# Patient Record
Sex: Female | Born: 1965 | Race: Black or African American | Hispanic: No | Smoking: Never smoker
Health system: Southern US, Community
[De-identification: ages and names within clinical notes are randomized; demographics above are authoritative.]

## PROBLEM LIST (undated history)

## (undated) DIAGNOSIS — E119 Type 2 diabetes mellitus without complications: Secondary | ICD-10-CM

## (undated) DIAGNOSIS — I10 Essential (primary) hypertension: Secondary | ICD-10-CM

---

## 2006-09-03 ENCOUNTER — Emergency Department: Payer: Self-pay | Admitting: Emergency Medicine

## 2006-12-25 HISTORY — PX: BREAST BIOPSY: SHX20

## 2007-10-08 ENCOUNTER — Ambulatory Visit: Payer: Self-pay

## 2010-08-01 ENCOUNTER — Emergency Department: Payer: Self-pay | Admitting: Emergency Medicine

## 2011-07-25 ENCOUNTER — Emergency Department: Payer: Self-pay | Admitting: Internal Medicine

## 2012-05-10 ENCOUNTER — Emergency Department: Payer: Self-pay | Admitting: Emergency Medicine

## 2012-12-21 ENCOUNTER — Emergency Department: Payer: Self-pay | Admitting: Emergency Medicine

## 2013-07-21 ENCOUNTER — Emergency Department: Payer: Self-pay | Admitting: Emergency Medicine

## 2013-07-21 LAB — CBC
HCT: 30.4 % — ABNORMAL LOW (ref 35.0–47.0)
HGB: 10.7 g/dL — ABNORMAL LOW (ref 12.0–16.0)
MCH: 26.2 pg (ref 26.0–34.0)
MCHC: 32.8 g/dL (ref 32.0–36.0)
MCV: 80 fL (ref 80–100)
Platelet: 338 10*3/uL (ref 150–440)
Platelet: 341 10*3/uL (ref 150–440)
RBC: 4.13 10*6/uL (ref 3.80–5.20)
RDW: 14.8 % — ABNORMAL HIGH (ref 11.5–14.5)
WBC: 5.8 10*3/uL (ref 3.6–11.0)

## 2013-07-21 LAB — GC/CHLAMYDIA PROBE AMP

## 2013-07-21 LAB — WET PREP, GENITAL

## 2013-12-17 ENCOUNTER — Encounter: Payer: Self-pay | Admitting: Family Medicine

## 2014-03-13 ENCOUNTER — Emergency Department: Payer: Self-pay | Admitting: Emergency Medicine

## 2014-04-26 ENCOUNTER — Emergency Department: Payer: Self-pay | Admitting: Emergency Medicine

## 2014-04-26 LAB — BASIC METABOLIC PANEL
Anion Gap: 7 (ref 7–16)
BUN: 12 mg/dL (ref 7–18)
CALCIUM: 9.1 mg/dL (ref 8.5–10.1)
CREATININE: 0.96 mg/dL (ref 0.60–1.30)
Chloride: 106 mmol/L (ref 98–107)
Co2: 25 mmol/L (ref 21–32)
EGFR (Non-African Amer.): >60
Glucose: 90 mg/dL (ref 65–99)
OSMOLALITY: 275 (ref 275–301)
Potassium: 4.1 mmol/L (ref 3.5–5.1)
Sodium: 138 mmol/L (ref 136–145)

## 2014-04-26 LAB — CBC
HCT: 41.6 % (ref 35.0–47.0)
HGB: 13 g/dL (ref 12.0–16.0)
MCH: 26.5 pg (ref 26.0–34.0)
MCHC: 31.2 g/dL — AB (ref 32.0–36.0)
MCV: 85 fL (ref 80–100)
PLATELETS: 231 10*3/uL (ref 150–440)
RBC: 4.89 10*6/uL (ref 3.80–5.20)
RDW: 14.6 % — ABNORMAL HIGH (ref 11.5–14.5)
WBC: 2.9 10*3/uL — ABNORMAL LOW (ref 3.6–11.0)

## 2014-04-26 LAB — TROPONIN I: Troponin-I: <0.02 ng/mL

## 2015-07-08 ENCOUNTER — Other Ambulatory Visit: Payer: Self-pay | Admitting: Family Medicine

## 2015-07-08 DIAGNOSIS — Z1231 Encounter for screening mammogram for malignant neoplasm of breast: Secondary | ICD-10-CM

## 2015-07-12 ENCOUNTER — Ambulatory Visit
Admission: RE | Admit: 2015-07-12 | Discharge: 2015-07-12 | Disposition: A | Discharge status: Home / Self Care | Payer: Commercial Managed Care - PPO | Source: Ambulatory Visit | Attending: Family Medicine | Admitting: Family Medicine

## 2015-07-12 DIAGNOSIS — Z1231 Encounter for screening mammogram for malignant neoplasm of breast: Secondary | ICD-10-CM

## 2015-09-27 ENCOUNTER — Ambulatory Visit
Admission: RE | Admit: 2015-09-27 | Discharge: 2015-09-27 | Disposition: A | Discharge status: Home / Self Care | Payer: Commercial Managed Care - PPO | Source: Ambulatory Visit | Attending: Family Medicine | Admitting: Family Medicine

## 2015-09-27 ENCOUNTER — Other Ambulatory Visit: Payer: Self-pay | Admitting: Family Medicine

## 2015-09-27 DIAGNOSIS — R071 Chest pain on breathing: Secondary | ICD-10-CM

## 2016-09-20 ENCOUNTER — Other Ambulatory Visit: Payer: Self-pay | Admitting: Family Medicine

## 2016-09-20 DIAGNOSIS — Z1231 Encounter for screening mammogram for malignant neoplasm of breast: Secondary | ICD-10-CM

## 2016-10-12 ENCOUNTER — Ambulatory Visit: Payer: Commercial Managed Care - PPO | Attending: Family Medicine

## 2017-05-07 ENCOUNTER — Ambulatory Visit
Admission: RE | Admit: 2017-05-07 | Discharge: 2017-05-07 | Disposition: A | Discharge status: Home / Self Care | Payer: 59 | Source: Ambulatory Visit | Attending: Family Medicine | Admitting: Family Medicine

## 2017-05-07 ENCOUNTER — Other Ambulatory Visit: Payer: Self-pay | Admitting: Family Medicine

## 2017-05-07 DIAGNOSIS — M25512 Pain in left shoulder: Secondary | ICD-10-CM | POA: Insufficient documentation

## 2017-05-07 DIAGNOSIS — M542 Cervicalgia: Secondary | ICD-10-CM

## 2017-11-20 ENCOUNTER — Other Ambulatory Visit: Payer: Self-pay | Admitting: Family Medicine

## 2017-11-20 DIAGNOSIS — Z1231 Encounter for screening mammogram for malignant neoplasm of breast: Secondary | ICD-10-CM

## 2018-02-07 ENCOUNTER — Encounter (HOSPITAL_COMMUNITY): Payer: Self-pay

## 2018-02-07 ENCOUNTER — Ambulatory Visit
Admission: RE | Admit: 2018-02-07 | Discharge: 2018-02-07 | Disposition: A | Discharge status: Home / Self Care | Payer: Commercial Managed Care - PPO | Source: Ambulatory Visit | Attending: Family Medicine | Admitting: Family Medicine

## 2018-02-07 DIAGNOSIS — Z1231 Encounter for screening mammogram for malignant neoplasm of breast: Secondary | ICD-10-CM | POA: Insufficient documentation

## 2019-01-22 ENCOUNTER — Other Ambulatory Visit: Payer: Self-pay | Admitting: Family Medicine

## 2019-01-22 DIAGNOSIS — Z1231 Encounter for screening mammogram for malignant neoplasm of breast: Secondary | ICD-10-CM

## 2019-05-22 ENCOUNTER — Inpatient Hospital Stay: Admission: RE | Admit: 2019-05-22 | Payer: Commercial Managed Care - PPO | Source: Ambulatory Visit

## 2019-06-21 ENCOUNTER — Other Ambulatory Visit: Payer: Self-pay | Admitting: *Deleted

## 2019-06-21 DIAGNOSIS — Z20822 Contact with and (suspected) exposure to covid-19: Secondary | ICD-10-CM

## 2019-06-27 LAB — NOVEL CORONAVIRUS, NAA: SARS-CoV-2, NAA: NOT DETECTED

## 2019-06-30 ENCOUNTER — Telehealth: Payer: Self-pay | Admitting: Family Medicine

## 2019-06-30 NOTE — Telephone Encounter (Signed)
Patient advised of negative COVID result. Patient expressed understanding.

## 2019-07-17 ENCOUNTER — Ambulatory Visit
Admission: RE | Admit: 2019-07-17 | Discharge: 2019-07-17 | Disposition: A | Discharge status: Home / Self Care | Payer: Commercial Managed Care - PPO | Source: Ambulatory Visit | Attending: Family Medicine | Admitting: Family Medicine

## 2019-07-17 DIAGNOSIS — Z1231 Encounter for screening mammogram for malignant neoplasm of breast: Secondary | ICD-10-CM | POA: Diagnosis present

## 2020-05-10 ENCOUNTER — Ambulatory Visit: Payer: Commercial Managed Care - PPO | Attending: Internal Medicine

## 2020-05-10 DIAGNOSIS — Z23 Encounter for immunization: Secondary | ICD-10-CM

## 2020-05-10 NOTE — Progress Notes (Signed)
   Covid-19 Vaccination Clinic  Name:  Holly Herrera    MRN: [id number] DOB: April 03, 1966  05/10/2020  Holly Herrera was observed post Covid-19 immunization for 15 minutes without incident. She was provided with Vaccine Information Sheet and instruction to access the V-Safe system.   Holly Herrera was instructed to call 911 with any severe reactions post vaccine: Marland Kitchen Difficulty breathing  . Swelling of face and throat  . A fast heartbeat  . A bad rash all over body  . Dizziness and weakness   Immunizations Administered    Name Date Dose VIS Date Route   Pfizer COVID-19 Vaccine 05/10/2020 11:31 AM 0.3 mL 02/18/2019 Intramuscular   Manufacturer: [employer], [employer]   Lot: [id number]   NDC: [address]-4

## 2020-09-06 ENCOUNTER — Other Ambulatory Visit: Payer: Self-pay

## 2020-09-06 DIAGNOSIS — Z20822 Contact with and (suspected) exposure to covid-19: Secondary | ICD-10-CM

## 2020-09-07 LAB — SARS-COV-2, NAA 2 DAY TAT

## 2020-09-07 LAB — NOVEL CORONAVIRUS, NAA: SARS-CoV-2, NAA: NOT DETECTED

## 2020-11-01 ENCOUNTER — Other Ambulatory Visit: Payer: Self-pay | Admitting: Family Medicine

## 2020-11-01 DIAGNOSIS — Z1231 Encounter for screening mammogram for malignant neoplasm of breast: Secondary | ICD-10-CM

## 2020-12-09 ENCOUNTER — Ambulatory Visit
Admission: RE | Admit: 2020-12-09 | Discharge: 2020-12-09 | Disposition: A | Discharge status: Home / Self Care | Payer: BC Managed Care – PPO | Source: Ambulatory Visit | Attending: Family Medicine | Admitting: Family Medicine

## 2020-12-09 ENCOUNTER — Other Ambulatory Visit: Payer: Self-pay

## 2020-12-09 DIAGNOSIS — Z1231 Encounter for screening mammogram for malignant neoplasm of breast: Secondary | ICD-10-CM

## 2021-01-29 ENCOUNTER — Other Ambulatory Visit: Payer: Self-pay

## 2021-01-29 ENCOUNTER — Emergency Department
Admission: EM | Admit: 2021-01-29 | Discharge: 2021-01-29 | Disposition: A | Discharge status: Home / Self Care | Payer: BC Managed Care – PPO | Attending: Emergency Medicine | Admitting: Emergency Medicine

## 2021-01-29 ENCOUNTER — Emergency Department: Payer: BC Managed Care – PPO

## 2021-01-29 DIAGNOSIS — E119 Type 2 diabetes mellitus without complications: Secondary | ICD-10-CM | POA: Diagnosis not present

## 2021-01-29 DIAGNOSIS — S161XXA Strain of muscle, fascia and tendon at neck level, initial encounter: Secondary | ICD-10-CM | POA: Diagnosis not present

## 2021-01-29 DIAGNOSIS — M79602 Pain in left arm: Secondary | ICD-10-CM

## 2021-01-29 DIAGNOSIS — R079 Chest pain, unspecified: Secondary | ICD-10-CM | POA: Diagnosis not present

## 2021-01-29 DIAGNOSIS — X58XXXA Exposure to other specified factors, initial encounter: Secondary | ICD-10-CM | POA: Diagnosis not present

## 2021-01-29 DIAGNOSIS — I10 Essential (primary) hypertension: Secondary | ICD-10-CM | POA: Diagnosis not present

## 2021-01-29 DIAGNOSIS — M25512 Pain in left shoulder: Secondary | ICD-10-CM | POA: Diagnosis not present

## 2021-01-29 DIAGNOSIS — M79622 Pain in left upper arm: Secondary | ICD-10-CM | POA: Diagnosis not present

## 2021-01-29 DIAGNOSIS — S199XXA Unspecified injury of neck, initial encounter: Secondary | ICD-10-CM | POA: Diagnosis present

## 2021-01-29 HISTORY — DX: Essential (primary) hypertension: I10

## 2021-01-29 HISTORY — DX: Type 2 diabetes mellitus without complications: E11.9

## 2021-01-29 LAB — COMPREHENSIVE METABOLIC PANEL
ALT: 33 U/L (ref 0–44)
AST: 32 U/L (ref 15–41)
Albumin: 4 g/dL (ref 3.5–5.0)
Alkaline Phosphatase: 71 U/L (ref 38–126)
Anion gap: 10 (ref 5–15)
BUN: 13 mg/dL (ref 6–20)
CO2: 25 mmol/L (ref 22–32)
Calcium: 9.5 mg/dL (ref 8.9–10.3)
Chloride: 104 mmol/L (ref 98–111)
Creatinine, Ser: 0.93 mg/dL (ref 0.44–1.00)
GFR, Estimated: >60 mL/min (ref >60)
Glucose, Bld: 129 mg/dL — ABNORMAL HIGH (ref 70–99)
Potassium: 3.7 mmol/L (ref 3.5–5.1)
Sodium: 139 mmol/L (ref 135–145)
Total Bilirubin: 0.5 mg/dL (ref 0.3–1.2)
Total Protein: 7.4 g/dL (ref 6.5–8.1)

## 2021-01-29 LAB — TROPONIN I (HIGH SENSITIVITY): Troponin I (High Sensitivity): 2 ng/L (ref <18)

## 2021-01-29 LAB — BRAIN NATRIURETIC PEPTIDE: B Natriuretic Peptide: 29.9 pg/mL (ref 0.0–100.0)

## 2021-01-29 MED ORDER — CYCLOBENZAPRINE HCL 10 MG PO TABS: 10.0000 mg | ORAL_TABLET | Freq: Three times a day (TID) | ORAL | 0 refills | Status: AC | PRN

## 2021-01-29 NOTE — ED Triage Notes (Addendum)
Pt arrives via EMS for left sided neck pain that radiates to left shoulder, left arm, and left chest- pt states it woke her up at 3 this morning and she could not get the pain to go away- pt has a history of heart valve problems- per EMS 12 lead and vitals stable

## 2021-01-29 NOTE — ED Notes (Signed)
X-ray at bedside

## 2021-01-29 NOTE — ED Notes (Signed)
Dr. Bradler at bedside 

## 2021-01-29 NOTE — ED Provider Notes (Signed)
[facility] Emergency Department Provider Note   ____________________________________________   Event Date/Time   First MD Initiated Contact with Patient 01/29/21 [phone]     (approximate)  I have reviewed the triage vital signs and the nursing notes.   HISTORY  Chief Complaint Neck Pain    HPI Holly Herrera is a 55 y.o. female with a stated past medical history of type 2 diabetes, hypertension, and unknown valvular abnormality presents via EMS for left-sided neck pain that radiates to the left shoulder and left chest that began approximately 4 hours prior to arrival and has been stable since onset.  Patient describes a 5/10 aching left neck/arm/upper chest pain that does not radiate and has no exertional component.  Patient denies any change in pain with palpation.  Patient denies any similar pains in the past.  Patient currently denies any vision changes, tinnitus, difficulty speaking, facial droop, sore throat, shortness of breath, abdominal pain, nausea/vomiting/diarrhea, dysuria, or weakness/numbness/paresthesias in any extremity         Past Medical History:  Diagnosis Date  . Diabetes mellitus without complication (HCC)   . Hypertension     There are no problems to display for this patient.   Past Surgical History:  Procedure Laterality Date  . BREAST BIOPSY Left 2008   neg, done at surgeon's office- neg    Prior to Admission medications   Medication Sig Start Date End Date Taking? Authorizing Provider  cyclobenzaprine (FLEXERIL) 10 MG tablet Take 1 tablet (10 mg total) by mouth 3 (three) times daily as needed for up to 4 days for muscle spasms (left chest pain). 01/29/21 02/02/21 Yes Merwyn Katos, MD    Allergies Sulfa antibiotics  Family History  Problem Relation Age of Onset  . Breast cancer Neg Hx     Social History Social History   Tobacco Use  . Smoking status: Never Smoker  . Smokeless tobacco: Never Used    Review of  Systems Constitutional: No fever/chills Eyes: No visual changes. ENT: No sore throat.   Cardiovascular: Denies chest pain. Respiratory: Denies shortness of breath. Gastrointestinal: No abdominal pain.  No nausea, no vomiting.  No diarrhea. Genitourinary: Negative for dysuria. Musculoskeletal: Positive for acute left neck/arm/upper chest wall pain Skin: Negative for rash. Neurological: Negative for headaches, weakness/numbness/paresthesias in any extremity Psychiatric: Negative for suicidal ideation/homicidal ideation   ____________________________________________   PHYSICAL EXAM:  VITAL SIGNS: ED Triage Vitals  Enc Vitals Group     BP 01/29/21 0736 131/84     Pulse Rate 01/29/21 0736 64     Resp 01/29/21 0736 18     Temp 01/29/21 0736 98.2 F (36.8 C)     Temp Source 01/29/21 0736 Oral     SpO2 01/29/21 0736 100 %     Weight 01/29/21 0738 187 lb (84.8 kg)     Height 01/29/21 0738 5\' 5"  (1.651 m)     Head Circumference --      Peak Flow --      Pain Score 01/29/21 0737 5     Pain Loc --      Pain Edu? --      Excl. in GC? --    Constitutional: Alert and oriented. Well appearing and in no acute distress. Eyes: Conjunctivae are normal. PERRL. Head: Atraumatic. Nose: No congestion/rhinnorhea. Mouth/Throat: Mucous membranes are moist. Neck: No stridor, mild tenderness to palpation in left paraspinal musculature Cardiovascular: Grossly normal heart sounds.  Good peripheral circulation. Respiratory: Normal respiratory effort.  No retractions. Gastrointestinal: Soft and nontender. No distention. Musculoskeletal: No obvious deformities Neurologic:  Normal speech and language. No gross focal neurologic deficits are appreciated. Skin:  Skin is warm and dry. No rash noted. Psychiatric: Mood and affect are normal. Speech and behavior are normal.  ____________________________________________   LABS (all labs ordered are listed, but only abnormal results are displayed)  Labs  Reviewed  COMPREHENSIVE METABOLIC PANEL - Abnormal; Notable for the following components:      Result Value   Glucose, Bld 129 (*)    All other components within normal limits  BRAIN NATRIURETIC PEPTIDE  TROPONIN I (HIGH SENSITIVITY)  TROPONIN I (HIGH SENSITIVITY)   ____________________________________________  EKG  ED ECG REPORT I, Merwyn Katos, the attending physician, personally viewed and interpreted this ECG.  Date: 01/29/2021 EKG Time: 0742 Rate: 61 Rhythm: normal sinus rhythm QRS Axis: normal Intervals: normal ST/T Wave abnormalities: normal Narrative Interpretation: no evidence of acute ischemia  ____________________________________________  RADIOLOGY  ED MD interpretation: One-view portable chest x-ray shows no evidence of acute abnormalities including no pneumonia, pneumothorax, or widened mediastinum  Official radiology report(s): DG Chest Port 1 View  Result Date: 01/29/2021 CLINICAL DATA:  Chest pain EXAM: PORTABLE CHEST 1 VIEW COMPARISON:  09/27/2015 FINDINGS: Normal heart size and mediastinal contours. No acute infiltrate or edema. No effusion or pneumothorax. No acute osseous findings. IMPRESSION: Negative chest. Electronically Signed   By: Marnee Spring M.D.   On: 01/29/2021 08:02    ____________________________________________   PROCEDURES  Procedure(s) performed (including Critical Care):  .1-3 Lead EKG Interpretation Performed by: Merwyn Katos, MD Authorized by: Merwyn Katos, MD     Interpretation: normal     ECG rate:  64   ECG rate assessment: normal     Rhythm: sinus rhythm     Ectopy: none     Conduction: normal       ____________________________________________   INITIAL IMPRESSION / ASSESSMENT AND PLAN / ED COURSE  As part of my medical decision making, I reviewed the following data within the electronic MEDICAL RECORD NUMBER Nursing notes reviewed and incorporated, Labs reviewed, EKG interpreted, Old chart reviewed,  Radiograph reviewed and Notes from prior ED visits reviewed and incorporated        + neck pain + Left arm pain  ED Workup: Defer C-Spine imaging given negative by NEXUS criteria  Given History, Exam the patient appears to have a cervical radiculopathy.   Patient appears to be low risk for complications or other emergent conditions such as  anginal equivalent, frank cervical instability, arterial dissection, osteomyelitis, epidural abscess, central cord syndrome, c-spine fracture, other spinal emergencies  Rx: NSAIDs, outpatient physical therapy evaluation and recommendation for home exercises in the interim Disposition: Discharge. The patient has been given strict return precautions and understands the need to follow up within 48 hours with their primary care provider      ____________________________________________   FINAL CLINICAL IMPRESSION(S) / ED DIAGNOSES  Final diagnoses:  Acute strain of neck muscle, initial encounter  Musculoskeletal pain of left upper extremity     ED Discharge Orders         Ordered    cyclobenzaprine (FLEXERIL) 10 MG tablet  3 times daily PRN        01/29/21 0859           Note:  This document was prepared using Dragon voice recognition software and may include unintentional dictation errors.   Merwyn Katos, MD 01/29/21 [phone]

## 2021-06-28 ENCOUNTER — Emergency Department: Payer: Self-pay

## 2021-06-28 ENCOUNTER — Other Ambulatory Visit: Payer: Self-pay

## 2021-06-28 ENCOUNTER — Emergency Department
Admission: EM | Admit: 2021-06-28 | Discharge: 2021-06-28 | Disposition: A | Discharge status: Home / Self Care | Payer: Self-pay | Attending: Emergency Medicine | Admitting: Emergency Medicine

## 2021-06-28 DIAGNOSIS — Z79899 Other long term (current) drug therapy: Secondary | ICD-10-CM | POA: Insufficient documentation

## 2021-06-28 DIAGNOSIS — E119 Type 2 diabetes mellitus without complications: Secondary | ICD-10-CM | POA: Insufficient documentation

## 2021-06-28 DIAGNOSIS — G4762 Sleep related leg cramps: Secondary | ICD-10-CM | POA: Insufficient documentation

## 2021-06-28 DIAGNOSIS — R42 Dizziness and giddiness: Secondary | ICD-10-CM | POA: Insufficient documentation

## 2021-06-28 DIAGNOSIS — R002 Palpitations: Secondary | ICD-10-CM | POA: Insufficient documentation

## 2021-06-28 DIAGNOSIS — I10 Essential (primary) hypertension: Secondary | ICD-10-CM | POA: Insufficient documentation

## 2021-06-28 LAB — CBC
HCT: 45.3 % (ref 36.0–46.0)
Hemoglobin: 14.7 g/dL (ref 12.0–15.0)
MCH: 27.6 pg (ref 26.0–34.0)
MCHC: 32.5 g/dL (ref 30.0–36.0)
MCV: 85.2 fL (ref 80.0–100.0)
Platelets: 316 10*3/uL (ref 150–400)
RBC: 5.32 MIL/uL — ABNORMAL HIGH (ref 3.87–5.11)
RDW: 13.5 % (ref 11.5–15.5)
WBC: 8.5 10*3/uL (ref 4.0–10.5)
nRBC: 0 % (ref 0.0–0.2)

## 2021-06-28 LAB — BASIC METABOLIC PANEL
Anion gap: 11 (ref 5–15)
BUN: 14 mg/dL (ref 6–20)
CO2: 26 mmol/L (ref 22–32)
Calcium: 9.9 mg/dL (ref 8.9–10.3)
Chloride: 99 mmol/L (ref 98–111)
Creatinine, Ser: 1.02 mg/dL — ABNORMAL HIGH (ref 0.44–1.00)
GFR, Estimated: >60 mL/min (ref >60)
Glucose, Bld: 180 mg/dL — ABNORMAL HIGH (ref 70–99)
Potassium: 3.4 mmol/L — ABNORMAL LOW (ref 3.5–5.1)
Sodium: 136 mmol/L (ref 135–145)

## 2021-06-28 LAB — TROPONIN I (HIGH SENSITIVITY): Troponin I (High Sensitivity): 3 ng/L (ref <18)

## 2021-06-28 NOTE — ED Notes (Signed)
Patient transported to X-ray 

## 2021-06-28 NOTE — ED Provider Notes (Signed)
[facility] Emergency Department Provider Note  Time seen: 9:38 AM  I have reviewed the triage vital signs and the nursing notes.   HISTORY  Chief Complaint Palpitations   HPI Holly Herrera is a 55 y.o. female with a past medical history of diabetes, hypertension, presents to the emergency department for palpitations and not feeling well.  According to the patient she was feeling somewhat dizzy yesterday thought her blood pressure to be elevated states she recently ran out of her metoprolol so she took one of her husbands blood pressure pills quinapril.  Patient states she was up all night urinating feeling like her legs are cramping and having palpitations.  Patient was concerned so she came to the emergency department this morning for evaluation.  Patient states she is feeling better.  Vital signs are reassuring, pulse rate is slightly elevated at 98 blood pressure is reassuring.  Patient denies any chest pain nausea or shortness of breath.  No abdominal pain.  Largely negative review of systems.  Past Medical History:  Diagnosis Date   Diabetes mellitus without complication (HCC)    Hypertension     There are no problems to display for this patient.   Past Surgical History:  Procedure Laterality Date   BREAST BIOPSY Left 2008   neg, done at surgeon's office- neg    Prior to Admission medications   Not on File    Allergies  Allergen Reactions   Sulfa Antibiotics     Family History  Problem Relation Age of Onset   Breast cancer Neg Hx     Social History Social History   Tobacco Use   Smoking status: Never   Smokeless tobacco: Never  Substance Use Topics   Alcohol use: Yes   Drug use: Not Currently    Review of Systems Constitutional: Negative for fever. Cardiovascular: Negative for chest pain.  Positive for palpitations. Respiratory: Negative for shortness of breath. Gastrointestinal: Negative for abdominal pain Genitourinary:  Negative for urinary compaints Musculoskeletal: Negative for musculoskeletal complaints Neurological: Negative for headache All other ROS negative  ____________________________________________   PHYSICAL EXAM:  VITAL SIGNS: ED Triage Vitals  Enc Vitals Group     BP 06/28/21 0915 123/79     Pulse Rate 06/28/21 0915 98     Resp 06/28/21 0915 17     Temp 06/28/21 0915 98 F (36.7 C)     Temp Source 06/28/21 0915 Oral     SpO2 06/28/21 0915 98 %     Weight 06/28/21 0913 181 lb (82.1 kg)     Height 06/28/21 0913 5\' 5"  (1.651 m)     Head Circumference --      Peak Flow --      Pain Score 06/28/21 0912 7     Pain Loc --      Pain Edu? --      Excl. in GC? --    Constitutional: Alert and oriented. Well appearing and in no distress. Eyes: Normal exam ENT      Head: Normocephalic and atraumatic.      Mouth/Throat: Mucous membranes are moist. Cardiovascular: Normal rate, regular rhythm.  Respiratory: Normal respiratory effort without tachypnea nor retractions. Breath sounds are clear  Gastrointestinal: Soft and nontender. No distention. Musculoskeletal: Nontender with normal range of motion in all extremities.  Neurologic:  Normal speech and language. No gross focal neurologic deficits  Skin:  Skin is warm, dry and intact.  Psychiatric: Mood and affect are normal.   ____________________________________________  EKG  EKG viewed and interpreted by myself shows a normal sinus rhythm at 97 bpm with a narrow QRS, normal axis, normal intervals, no concerning ST changes.   INITIAL IMPRESSION / ASSESSMENT AND PLAN / ED COURSE  Pertinent labs & imaging results that were available during my care of the patient were reviewed by me and considered in my medical decision making (see chart for details).   Patient presents emergency department for palpitations dizziness after taking one of her husbands blood pressure pills.  Patient took the pill around 7:30 PM last night states she was  up all night urinating with leg cramps and palpitations.  Patient admits that she was very nervous a whole night states she was up "praying all night."  We will check basic labs, EKG, troponin and continue to closely monitor.  Overall the patient appears well with reassuring vitals and reassuring physical exam  Holly Herrera was evaluated in Emergency Department on 06/28/2021 for the symptoms described in the history of present illness. She was evaluated in the context of the global COVID-19 pandemic, which necessitated consideration that the patient might be at risk for infection with the SARS-CoV-2 virus that causes COVID-19. Institutional protocols and algorithms that pertain to the evaluation of patients at risk for COVID-19 are in a state of rapid change based on information released by regulatory bodies including the CDC and federal and state organizations. These policies and algorithms were followed during the patient's care in the ED.  ____________________________________________   FINAL CLINICAL IMPRESSION(S) / ED DIAGNOSES  Palpitations   Minna Antis, MD 06/28/21 [phone]

## 2021-06-28 NOTE — ED Triage Notes (Signed)
Pt states she ran out of her normal metoprolol and took one of her husbands quinapril, states she was up all night feeling like her heart was racing and having leg cramps.

## 2021-07-04 ENCOUNTER — Other Ambulatory Visit: Payer: Self-pay

## 2021-07-04 ENCOUNTER — Ambulatory Visit: Payer: Self-pay | Admitting: Physician Assistant

## 2021-07-04 DIAGNOSIS — Z113 Encounter for screening for infections with a predominantly sexual mode of transmission: Secondary | ICD-10-CM

## 2021-07-04 LAB — WET PREP FOR TRICH, YEAST, CLUE
Trichomonas Exam: NEGATIVE
Yeast Exam: NEGATIVE

## 2021-07-04 NOTE — Progress Notes (Signed)
Pt here for STD screening.  She has a UTI and is being treated with Macrodantin 100 mg QID.  Pt given condoms. Berdie Ogren, RN

## 2021-07-05 ENCOUNTER — Encounter: Payer: Self-pay | Admitting: Physician Assistant

## 2021-07-05 NOTE — Progress Notes (Signed)
[facility] Department STI clinic/screening visit  Subjective:  Holly Herrera is a 55 y.o. female being seen today for an STI screening visit. The patient reports they do not have symptoms.  Patient reports that they do not desire a pregnancy in the next year.   They reported they are not interested in discussing contraception today.  Patient's last menstrual period was 06/12/2015.   Patient has the following medical conditions:  There are no problems to display for this patient.   Chief Complaint  Patient presents with   SEXUALLY TRANSMITTED DISEASE    screening    HPI  Patient reports that she is not having any symptoms but would like a screening.  States that she has had an UTI and feels that every time she and her husband have sex, she gets an UTI and wants to make sure nothing else is going on.  Reports that she takes medicines as directed for HTN and DM and sees her PCP regularly.  Reports that she is postmenopausal and has not had a period in years, last HIV test and last pap was in 2020.   See flowsheet for further details and programmatic requirements.    The following portions of the patient's history were reviewed and updated as appropriate: allergies, current medications, past medical history, past social history, past surgical history and problem list.  Objective:  There were no vitals filed for this visit.  Physical Exam Constitutional:      General: She is not in acute distress.    Appearance: Normal appearance.  HENT:     Head: Normocephalic and atraumatic.     Comments: No nits,lice, or hair loss. No cervical, supraclavicular or axillary adenopathy.     Mouth/Throat:     Mouth: Mucous membranes are moist.     Pharynx: Oropharynx is clear. No oropharyngeal exudate or posterior oropharyngeal erythema.  Eyes:     Conjunctiva/sclera: Conjunctivae normal.  Pulmonary:     Effort: Pulmonary effort is normal.  Abdominal:     Palpations: Abdomen is  soft. There is no mass.     Tenderness: There is no abdominal tenderness. There is no guarding or rebound.  Genitourinary:    General: Normal vulva.     Rectum: Normal.     Comments: External genitalia/pubic area without nits, lice, edema, erythema, lesions and inguinal adenopathy. Vagina with normal mucosa and discharge. Cervix without visible lesions. Uterus firm, mobile, nt, no masses, no CMT, no adnexal tenderness or fullness.  Musculoskeletal:     Cervical back: Neck supple. No tenderness.  Skin:    General: Skin is warm and dry.     Findings: No bruising, erythema, lesion or rash.  Neurological:     Mental Status: She is alert and oriented to person, place, and time.  Psychiatric:        Mood and Affect: Mood normal.        Behavior: Behavior normal.        Thought Content: Thought content normal.        Judgment: Judgment normal.     Assessment and Plan:  Holly Herrera is a 55 y.o. female presenting to the [facility] Department for STI screening  1. Screening for STD (sexually transmitted disease) Patient into clinic without symptoms. Reviewed wet mount results and no treatment is indicated today. Rec condoms with all sex. Await test results.  Counseled that RN will call if needs to RTC for treatment once results are back.  -  WET PREP FOR TRICH, YEAST, CLUE - Chlamydia/Gonorrhea [place] [facility] - HIV [place] [facility] - Syphilis Serology,  Lab     No follow-ups on file.  No future appointments.  Matt Holmes, PA

## 2021-07-07 LAB — HM HIV SCREENING LAB: HM HIV Screening: NEGATIVE

## 2021-07-14 ENCOUNTER — Telehealth: Payer: Self-pay | Admitting: Family Medicine

## 2021-07-14 NOTE — Telephone Encounter (Signed)
Patient called to see if her results were in yet.

## 2022-01-31 ENCOUNTER — Other Ambulatory Visit: Payer: Self-pay | Admitting: Family Medicine

## 2022-01-31 DIAGNOSIS — Z1231 Encounter for screening mammogram for malignant neoplasm of breast: Secondary | ICD-10-CM

## 2022-02-07 DIAGNOSIS — K2270 Barrett's esophagus without dysplasia: Secondary | ICD-10-CM

## 2022-02-13 ENCOUNTER — Other Ambulatory Visit: Payer: Self-pay

## 2022-02-13 ENCOUNTER — Ambulatory Visit
Admission: RE | Admit: 2022-02-13 | Discharge: 2022-02-13 | Disposition: A | Discharge status: Home / Self Care | Payer: Self-pay | Source: Ambulatory Visit | Attending: Family Medicine | Admitting: Family Medicine

## 2022-02-13 DIAGNOSIS — Z1231 Encounter for screening mammogram for malignant neoplasm of breast: Secondary | ICD-10-CM | POA: Insufficient documentation

## 2022-03-13 ENCOUNTER — Ambulatory Visit: Payer: Self-pay

## 2022-03-15 ENCOUNTER — Emergency Department
Admission: EM | Admit: 2022-03-15 | Discharge: 2022-03-15 | Disposition: A | Discharge status: Home / Self Care | Payer: 59 | Attending: Emergency Medicine | Admitting: Emergency Medicine

## 2022-03-15 ENCOUNTER — Other Ambulatory Visit: Payer: Self-pay

## 2022-03-15 DIAGNOSIS — R519 Headache, unspecified: Secondary | ICD-10-CM | POA: Diagnosis present

## 2022-03-15 DIAGNOSIS — E1165 Type 2 diabetes mellitus with hyperglycemia: Secondary | ICD-10-CM | POA: Diagnosis not present

## 2022-03-15 DIAGNOSIS — I10 Essential (primary) hypertension: Secondary | ICD-10-CM | POA: Insufficient documentation

## 2022-03-15 DIAGNOSIS — Z7984 Long term (current) use of oral hypoglycemic drugs: Secondary | ICD-10-CM | POA: Insufficient documentation

## 2022-03-15 LAB — BASIC METABOLIC PANEL
Anion gap: 9 (ref 5–15)
BUN: 10 mg/dL (ref 6–20)
CO2: 26 mmol/L (ref 22–32)
Calcium: 9.2 mg/dL (ref 8.9–10.3)
Chloride: 99 mmol/L (ref 98–111)
Creatinine, Ser: 0.78 mg/dL (ref 0.44–1.00)
GFR, Estimated: >60 mL/min (ref >60)
Glucose, Bld: 270 mg/dL — ABNORMAL HIGH (ref 70–99)
Potassium: 3.5 mmol/L (ref 3.5–5.1)
Sodium: 134 mmol/L — ABNORMAL LOW (ref 135–145)

## 2022-03-15 LAB — URINALYSIS, ROUTINE W REFLEX MICROSCOPIC
Bacteria, UA: NONE SEEN
Bilirubin Urine: NEGATIVE
Glucose, UA: >=500 mg/dL — AB
Ketones, ur: 5 mg/dL — AB
Leukocytes,Ua: NEGATIVE
Nitrite: NEGATIVE
Protein, ur: NEGATIVE mg/dL
Specific Gravity, Urine: 1.027 (ref 1.005–1.030)
pH: 5 (ref 5.0–8.0)

## 2022-03-15 LAB — CBC
HCT: 41.1 % (ref 36.0–46.0)
Hemoglobin: 13.2 g/dL (ref 12.0–15.0)
MCH: 27.4 pg (ref 26.0–34.0)
MCHC: 32.1 g/dL (ref 30.0–36.0)
MCV: 85.3 fL (ref 80.0–100.0)
Platelets: 269 10*3/uL (ref 150–400)
RBC: 4.82 MIL/uL (ref 3.87–5.11)
RDW: 13.4 % (ref 11.5–15.5)
WBC: 6 10*3/uL (ref 4.0–10.5)
nRBC: 0 % (ref 0.0–0.2)

## 2022-03-15 LAB — CBG MONITORING, ED: Glucose-Capillary: 231 mg/dL — ABNORMAL HIGH (ref 70–99)

## 2022-03-15 NOTE — ED Provider Notes (Signed)
? ?[facility] ?Provider Note ? ? ? Event Date/Time  ? First MD Initiated Contact with Patient 03/15/22 2308   ?  (approximate) ? ? ?History  ? ?Hyperglycemia ? ? ?HPI ? ?Holly Herrera is a 56 y.o. female who recently lost her husband.  She reports he had a 3 stents placed and was supposed to stay in the hospital but would not and she came home after a week and found him dead.  Today she comes in complaining of waking up with a headache that gradually got worse over the course of the day it is pounding on the right side.  She has not had any headaches like this in the past.  Her blood sugar was also high and her blood pressure was high at work.  She took 2 metformin's on her other medicines.  Headache has improved since she has been here in the emergency room.  When I finished examining her the headache was gone.  She feels better and wants to go home.  She does not want a CAT scan.  With the headache being gone now and having a nonfocal neurological exam is probably not necessary to do a CT. ? ?  ? ? ?Physical Exam  ? ?Triage Vital Signs: ?ED Triage Vitals  ?Enc Vitals Group  ?   BP 03/15/22 2138 (!) 157/100  ?   Pulse Rate 03/15/22 2138 81  ?   Resp 03/15/22 2138 19  ?   Temp 03/15/22 2138 98.3 ?F (36.8 ?C)  ?   Temp src --   ?   SpO2 03/15/22 2138 99 %  ?   Weight 03/15/22 2141 175 lb (79.4 kg)  ?   Height 03/15/22 2141 5\' 6"  (1.676 m)  ?   Head Circumference --   ?   Peak Flow --   ?   Pain Score 03/15/22 2141 9  ?   Pain Loc --   ?   Pain Edu? --   ?   Excl. in GC? --   ? ? ?Most recent vital signs: ?Vitals:  ? 03/15/22 2237 03/15/22 2239  ?BP: 133/83 129/79  ?Pulse: 83 85  ?Resp:  16  ?Temp:    ?SpO2: 100% 100%  ? ? ? ?General: Awake, no distress.  ?Head normocephalic atraumatic ?Eyes pupils equal round reactive extraocular movements intact fundi look normal ?Mouth no erythema or exudate normal ?CV:  Good peripheral perfusion.  Heart regular rate and rhythm no audible  murmur ?Resp:  Normal effort.  Lungs are clear ?Abd:  No distention.  Soft bowel sounds positive no organomegaly ?Neuro exam cranial nerves II through XII are intact although visual fields were not checked cerebellar finger-nose is normal motor strength is 5/5 throughout patient does not report any numbness. ? ? ?ED Results / Procedures / Treatments  ? ?Labs ?(all labs ordered are listed, but only abnormal results are displayed) ?Labs Reviewed  ?BASIC METABOLIC PANEL - Abnormal; Notable for the following components:  ?    Result Value  ? Sodium 134 (*)   ? Glucose, Bld 270 (*)   ? All other components within normal limits  ?URINALYSIS, ROUTINE W REFLEX MICROSCOPIC - Abnormal; Notable for the following components:  ? Color, Urine YELLOW (*)   ? APPearance HAZY (*)   ? Glucose, UA >=500 (*)   ? Hgb urine dipstick SMALL (*)   ? Ketones, ur 5 (*)   ? All other components within normal limits  ?CBG  MONITORING, ED - Abnormal; Notable for the following components:  ? Glucose-Capillary 231 (*)   ? All other components within normal limits  ?CBC  ?CBG MONITORING, ED  ?CBG MONITORING, ED  ? ? ? ?EKG ? ?Cardiac monitor shows normal sinus rhythm at 80 ? ? ?RADIOLOGY ? ? ? ?PROCEDURES: ? ?Critical Care performe ? ?Procedures ? ? ?MEDICATIONS ORDERED IN ED: ?Medications - No data to display ? ? ?IMPRESSION / MDM / ASSESSMENT AND PLAN / ED COURSE  ?I reviewed the triage vital signs and the nursing notes. ?Patient had a right-sided throbbing headache which she has not had before.  She does not have any temporal tenderness.  Headache resolved on its own.  It may have been related to her elevated blood pressure is difficult to say.  May also been due to stress with the recent death of her husband. ? ?With a nonfocal neurological exam there is resolution of the headache normal funduscopic normal.ed10 ?Vital signs now.  I believe this is a nonspecific headache.  I will let her go.  I will have her follow-up with her regular doctor.  I  do not think we need to do any other further testing at this point.  I will have her return if the headache returns or her symptoms worsen again tonight. ?She does not have a sore throat or cough or fever or runny nose shortness of breath or chest tightness or pain or any other complaints.  There is no sign of infection.  Her white blood count is normal she is not anemic her platelet count is normal her electrolytes are okay sugar is slightly high now at 270 but this is really nonspecific especially with the stress. ? ?Patient does have hypertension and diabetes.  He also has high cholesterol.  She has ?The patient is on the cardiac monitor to evaluate for evidence of arrhythmia and/or significant heart rate changes.  None are seen blood pressure is 129/79.  Patient feels well I will let her go if she wishes. ? ?  ? ? ?FINAL CLINICAL IMPRESSION(S) / ED DIAGNOSES  ? ?Final diagnoses:  ?Acute nonintractable headache, unspecified headache type  ? ? ? ?Rx / DC Orders  ? ?ED Discharge Orders   ? ? None  ? ?  ? ? ? ?Note:  This document was prepared using Dragon voice recognition software and may include unintentional dictation errors. ?  ?Arnaldo Natal, MD ?03/15/22 2328 ? ?

## 2022-03-15 NOTE — Discharge Instructions (Signed)
Please return if headache gets bad again or if you have any other problems.  Otherwise please follow-up with your doctor within about a week or so.Marland Kitchen ?

## 2022-03-15 NOTE — ED Notes (Signed)
RN resized pt bp cuff. RN changed bp cuff to a bigger size due to pt circumference of her upper arm. ?

## 2022-03-15 NOTE — ED Triage Notes (Signed)
Pt presents to ER c/o hyperglycemia and HA that started this morning.  Pt states she checked her CBG this morning and it was 350.  Pt also c/o HA that has been constant throughout the day.  Pt also c/o nausea but denies vomiting.  Pt denies sick comtacts.  Pt is A&O x4 at this time in NAD.   ?

## 2022-07-05 ENCOUNTER — Ambulatory Visit
Admission: RE | Admit: 2022-07-05 | Discharge: 2022-07-05 | Disposition: A | Discharge status: Home / Self Care | Payer: 59 | Source: Ambulatory Visit | Attending: Nurse Practitioner | Admitting: Nurse Practitioner

## 2022-07-05 ENCOUNTER — Other Ambulatory Visit: Payer: Self-pay | Admitting: Nurse Practitioner

## 2022-07-05 DIAGNOSIS — R52 Pain, unspecified: Secondary | ICD-10-CM

## 2022-08-30 ENCOUNTER — Encounter: Payer: Self-pay | Admitting: Cardiovascular Disease

## 2022-08-30 ENCOUNTER — Ambulatory Visit: Payer: Self-pay | Attending: Cardiovascular Disease | Admitting: Cardiovascular Disease

## 2022-09-01 ENCOUNTER — Encounter: Payer: Self-pay | Admitting: Cardiovascular Disease

## 2022-09-01 ENCOUNTER — Ambulatory Visit: Payer: Self-pay | Attending: Cardiovascular Disease | Admitting: Cardiovascular Disease

## 2022-09-01 VITALS — BP 128/78 | HR 75 | Ht 65.5 in | Wt 182.2 lb

## 2022-09-01 DIAGNOSIS — I479 Paroxysmal tachycardia, unspecified: Secondary | ICD-10-CM

## 2022-09-01 DIAGNOSIS — E119 Type 2 diabetes mellitus without complications: Secondary | ICD-10-CM

## 2022-09-01 DIAGNOSIS — E782 Mixed hyperlipidemia: Secondary | ICD-10-CM

## 2022-09-01 DIAGNOSIS — I10 Essential (primary) hypertension: Secondary | ICD-10-CM

## 2022-09-01 DIAGNOSIS — R002 Palpitations: Secondary | ICD-10-CM

## 2022-09-01 NOTE — Progress Notes (Signed)
Cardiology Office Note  Date:  09/01/2022   ID:  Holly Herrera, DOB 12-Nov-1966, MRN [id number]  PCP:  Armando Gang, FNP   Chief Complaint  Patient presents with   New Patient (Initial Visit)    Ref by Dr. Thad Ranger for palpitations & heart racing with a family Hx of her father deceased 30 with MI/CHF & a brother deceased at 10 with an MI. Medications reviewed by the patient verbally.     HPI:  Holly Herrera is a 56 year old woman with past medical history of Diabetes type 2 Hypertension Hyperlipidemia Referred by Thad Ranger for consultation of her palpitations/" heart racing"  Previously seen by cardiology 2020 at [facility]  Previously reported episodes of chest pain, Myoview July 2020 with no ischemia Felt to have atypical chest pain  Echocardiogram also completed at that time NORMAL LEFT VENTRICULAR SYSTOLIC FUNCTION  NORMAL RIGHT VENTRICULAR SYSTOLIC FUNCTION  MILD VALVULAR REGURGITATION (See above)  NO VALVULAR STENOSIS  MILD MR, TR  TRIVIAL AR, PR  EF 55-60%   Seen in the emergency room June 28, 2021 for palpitations, dizziness after taking one of her husbands blood pressure pills Work-up negative  In follow-up today she reports that she is feeling relatively well Blood pressure has been well controlled, denies significant leg swelling or shortness of breath Takes chlorthalidone as needed with potassium for leg swelling Denies chest pain concerning for angina Concerned about following cardiac issues given strong family history  EKG personally reviewed by myself on todays visit NSR rate 75 bpm no significant ST or T wave changes  PMH:   has a past medical history of Diabetes mellitus without complication (HCC) and Hypertension.  PSH:    Past Surgical History:  Procedure Laterality Date   BREAST BIOPSY Left 2008   neg, done at surgeon's office- neg    Current Outpatient Medications  Medication Sig Dispense Refill   atorvastatin (LIPITOR) 20 MG  tablet SMARTSIG:1 Tablet(s) By Mouth Every Evening     chlorthalidone (HYGROTON) 25 MG tablet Take 25 mg by mouth daily as needed.     cyclobenzaprine (FLEXERIL) 10 MG tablet Take 10 mg by mouth daily.     FEROSUL 325 (65 Fe) MG tablet Take 325 mg by mouth daily.     glipiZIDE (GLUCOTROL) 5 MG tablet Take 5 mg by mouth daily.     losartan (COZAAR) 100 MG tablet Take 100 mg by mouth daily.     meloxicam (MOBIC) 15 MG tablet Take 15 mg by mouth daily as needed.     potassium chloride (KLOR-CON) 10 MEQ tablet Take by mouth.     traZODone (DESYREL) 50 MG tablet Take 50 mg by mouth at bedtime.     metFORMIN (GLUCOPHAGE) 500 MG tablet Take 500 mg by mouth 2 (two) times daily. (Patient not taking: Reported on 09/01/2022)     nitrofurantoin (MACRODANTIN) 100 MG capsule Take 100 mg by mouth 4 (four) times daily. (Patient not taking: Reported on 09/01/2022)     No current facility-administered medications for this visit.    Allergies:   Sulfa antibiotics   Social History:  The patient  reports that she has never smoked. She has never used smokeless tobacco. She reports current alcohol use. She reports that she does not currently use drugs.   Family History:   family history includes Cerebral aneurysm in her mother; Heart attack (age of onset: 68) in her father; Heart attack (age of onset: 46) in her brother; Heart failure in  her father; Hypertension in her sister.    Review of Systems: Review of Systems  Constitutional: Negative.   HENT: Negative.    Respiratory: Negative.    Cardiovascular: Negative.   Gastrointestinal: Negative.   Musculoskeletal: Negative.   Neurological: Negative.   Psychiatric/Behavioral: Negative.    All other systems reviewed and are negative.    PHYSICAL EXAM: VS:  BP 128/78 (BP Location: Right Arm, Patient Position: Sitting, Cuff Size: Normal)   Pulse 75   Ht 5' 5.5" (1.664 m)   Wt 182 lb 4 oz (82.7 kg)   LMP 06/12/2015 Comment: premenopausal  BMI 29.87 kg/m  ,  BMI Body mass index is 29.87 kg/m. GEN: Well nourished, well developed, in no acute distress HEENT: normal Neck: no JVD, carotid bruits, or masses Cardiac: RRR; no murmurs, rubs, or gallops,no edema  Respiratory:  clear to auscultation bilaterally, normal work of breathing GI: soft, nontender, nondistended, + BS MS: no deformity or atrophy Skin: warm and dry, no rash Neuro:  Strength and sensation are intact Psych: euthymic mood, full affect    Recent Labs: 03/15/2022: BUN 10; Creatinine, Ser 0.78; Hemoglobin 13.2; Platelets 269; Potassium 3.5; Sodium 134    Lipid Panel No results found for: "CHOL", "HDL", "LDLCALC", "TRIG"    Wt Readings from Last 3 Encounters:  09/01/22 182 lb 4 oz (82.7 kg)  03/15/22 175 lb (79.4 kg)  06/28/21 181 lb (82.1 kg)       ASSESSMENT AND PLAN:  Problem List Items Addressed This Visit   None Visit Diagnoses     Palpitations    -  Primary   Paroxysmal tachycardia (HCC)       Relevant Medications   losartan (COZAAR) 100 MG tablet   Diabetes mellitus type 2 in nonobese (HCC)       Relevant Medications   losartan (COZAAR) 100 MG tablet   glipiZIDE (GLUCOTROL) 5 MG tablet   Mixed hyperlipidemia       Relevant Medications   losartan (COZAAR) 100 MG tablet   Primary hypertension       Relevant Medications   losartan (COZAAR) 100 MG tablet     Essential hypertension Blood pressure is well controlled on today's visit. No changes made to the medications.  Palpitations Seems to happen more in the evening when she is laying down but typically resolved without intervention We have suggested if symptoms get worse she call us, Zio monitor could be ordered We could also restart her beta-blocker.  She was previously on metoprolol succinate 50 daily.  She does have leftover prescription at home that she can take as needed Recommend she call us if symptoms get worse  Diabetes type 2 We have encouraged continued exercise, careful diet management  in an effort to lose weight. She reports weight has been trending down used to be 220 now 182  Hyperlipidemia Minute by primary care , on Lipitor    Total encounter time more than 50 minutes  Greater than 50% was spent in counseling and coordination of care with the patient    Signed, Dossie Arbour, M.D., Ph.D. [facility] [place], [place] [phone]

## 2022-09-01 NOTE — Patient Instructions (Addendum)
Medication Instructions:  No changes  If you need a refill on your cardiac medications before your next appointment, please call your pharmacy.   Lab work: No new labs needed  Testing/Procedures: No new testing needed  Follow-Up: At [facility], you and your health needs are our priority.  As part of our continuing mission to provide you with exceptional heart care, we have created designated Provider Care Teams.  These Care Teams include your primary Cardiologist (physician) and Advanced Practice Providers (APPs -  Physician Assistants and Nurse Practitioners) who all work together to provide you with the care you need, when you need it.  You will need a follow up appointment in 12 months Your physician wants you to follow-up in: 1 year. You will receive a reminder letter in the mail two months in advance. If you don't receive a letter, please call our office to schedule the follow-up appointment.   Providers on your designated Care Team:   Nicolasa Ducking, NP Eula Listen, PA-C Cadence Fransico Michael, [place]  COVID-19 Vaccine Information can be found at: [email or web address] For questions related to vaccine distribution or appointments, please email [email or web address] or call [phone].

## 2022-10-26 LAB — EXTERNAL GENERIC LAB PROCEDURE

## 2022-11-29 LAB — EXTERNAL GENERIC LAB PROCEDURE

## 2023-01-18 ENCOUNTER — Other Ambulatory Visit: Payer: Self-pay | Admitting: Nurse Practitioner

## 2023-01-18 ENCOUNTER — Ambulatory Visit
Admission: RE | Admit: 2023-01-18 | Discharge: 2023-01-18 | Disposition: A | Discharge status: Home / Self Care | Payer: BLUE CROSS/BLUE SHIELD | Source: Ambulatory Visit | Attending: Nurse Practitioner | Admitting: Nurse Practitioner

## 2023-01-18 ENCOUNTER — Ambulatory Visit
Admission: RE | Admit: 2023-01-18 | Discharge: 2023-01-18 | Disposition: A | Discharge status: Home / Self Care | Payer: BLUE CROSS/BLUE SHIELD | Attending: Nurse Practitioner | Admitting: Nurse Practitioner

## 2023-01-18 DIAGNOSIS — R1084 Generalized abdominal pain: Secondary | ICD-10-CM

## 2023-01-24 ENCOUNTER — Ambulatory Visit: Payer: Self-pay

## 2023-01-27 LAB — EXTERNAL GENERIC LAB PROCEDURE: COLOGUARD: NEGATIVE

## 2023-01-27 LAB — COLOGUARD: COLOGUARD: NEGATIVE

## 2023-03-19 ENCOUNTER — Encounter: Payer: BLUE CROSS/BLUE SHIELD | Attending: Physician Assistant | Admitting: Physician Assistant

## 2023-03-19 DIAGNOSIS — X58XXXA Exposure to other specified factors, initial encounter: Secondary | ICD-10-CM | POA: Diagnosis not present

## 2023-03-19 DIAGNOSIS — E11622 Type 2 diabetes mellitus with other skin ulcer: Secondary | ICD-10-CM | POA: Diagnosis not present

## 2023-03-19 DIAGNOSIS — T22211A Burn of second degree of right forearm, initial encounter: Secondary | ICD-10-CM | POA: Diagnosis not present

## 2023-03-19 DIAGNOSIS — I10 Essential (primary) hypertension: Secondary | ICD-10-CM | POA: Insufficient documentation

## 2023-03-19 NOTE — Progress Notes (Signed)
Holly Herrera, Holly Herrera ([id number]) [id number] Page 1 of 8 Visit Report for 03/19/2023 Allergy List Details Patient Name: Date of Service: Holly Herrera, Holly Herrera 03/19/2023 2:30 PM Medical Record Number: [id number] Patient Account Number: [id number] Date of Birth/Sex: Treating RN: [birth year] (57 y.o. Orvan Falconer Primary Care Melysa Schroyer: Dionisio David Other Clinician: Referring Yazmen Briones: Treating Patricie Geeslin/Extender: Barbee Shropshire, Crystal Weeks in Treatment: 0 Allergies Active Allergies Sulfa (Sulfonamide Antibiotics) Allergy Notes Electronic Signature(s) Signed: 03/19/2023 3:50:04 PM By: Carlene Coria RN Entered By: Carlene Coria on 03/19/2023 14:40:27 -------------------------------------------------------------------------------- Arrival Information Details Patient Name: Date of Service: Holly Hora Herrera. 03/19/2023 2:30 PM Medical Record Number: [id number] Patient Account Number: [id number] Date of Birth/Sex: Treating RN: July 02, 1966 (57 y.o. Orvan Falconer Primary Care Latonda Larrivee: Dionisio David Other Clinician: Referring Allix Blomquist: Treating Daisie Haft/Extender: Barbee Shropshire, Vanna Scotland in Treatment: 0 Visit Information Patient Arrived: Ambulatory Arrival Time: 14:37 Accompanied By: self Transfer Assistance: None Patient Identification Verified: Yes Secondary Verification Process Completed: Yes Patient Requires Transmission-Based Precautions: No Patient Has Alerts: No Electronic Signature(s) Signed: 03/19/2023 3:50:04 PM By: Carlene Coria RN Entered By: Carlene Coria on 03/19/2023 14:38:11 -------------------------------------------------------------------------------- Clinic Level of Care Assessment Details Patient Name: Date of Service: Holly Herrera, Holly Herrera 03/19/2023 2:30 PM Medical Record Number: [id number] Patient Account Number: [id number] Date of Birth/Sex: Treating RN: 01/25/1966 (57 y.o. Orvan Falconer Primary Care Rhett Mutschler: Dionisio David Other  Clinician: Referring Malynn Lucy: Treating Paz Winsett/Extender: Barbee Shropshire, Crystal Weeks in Treatment: 0 Clinic Level of Care Assessment Items TOOL 1 Quantity Score X- 1 0 Use when EandM and Procedure is performed on INITIAL visit ASSESSMENTS - Nursing Assessment / Reassessment X- 1 20 General Physical Exam (combine w/ comprehensive assessment (listed just below) when performed on new pt. evals) X- 1 25 Comprehensive Assessment (HX, ROS, Risk Assessments, Wounds Hx, etc.) Holly Herrera, Holly Herrera ([id number]) [phone].pdf Page 2 of 8 ASSESSMENTS - Wound and Skin Assessment / Reassessment []  - 0 Dermatologic / Skin Assessment (not related to wound area) ASSESSMENTS - Ostomy and/or Continence Assessment and Care []  - 0 Incontinence Assessment and Management []  - 0 Ostomy Care Assessment and Management (repouching, etc.) PROCESS - Coordination of Care X - Simple Patient / Family Education for ongoing care 1 15 []  - 0 Complex (extensive) Patient / Family Education for ongoing care []  - 0 Staff obtains Programmer, systems, Records, T Results / Process Orders est []  - 0 Staff telephones HHA, Nursing Homes / Clarify orders / etc []  - 0 Routine Transfer to another Facility (non-emergent condition) []  - 0 Routine Hospital Admission (non-emergent condition) X- 1 15 New Admissions / Biomedical engineer / Ordering NPWT Apligraf, etc. , []  - 0 Emergency Hospital Admission (emergent condition) PROCESS - Special Needs []  - 0 Pediatric / Minor Patient Management []  - 0 Isolation Patient Management []  - 0 Hearing / Language / Visual special needs []  - 0 Assessment of Community assistance (transportation, Herrera/C planning, etc.) []  - 0 Additional assistance / Altered mentation []  - 0 Support Surface(s) Assessment (bed, cushion, seat, etc.) INTERVENTIONS - Miscellaneous []  - 0 External ear exam []  - 0 Patient Transfer (multiple staff / Civil Service fast streamer / Similar devices) []  -  0 Simple Staple / Suture removal (25 or less) []  - 0 Complex Staple / Suture removal (26 or more) []  - 0 Hypo/Hyperglycemic Management (do not check if billed separately) []  - 0 Ankle / Brachial Index (ABI) - do not check if billed separately Has the patient been seen at the hospital within the last three years: Yes  Total Score: 75 Level Of Care: New/Established - Level 2 Electronic Signature(s) Signed: 03/19/2023 3:50:04 PM By: Carlene Coria RN Entered By: Carlene Coria on 03/19/2023 15:25:10 -------------------------------------------------------------------------------- Encounter Discharge Information Details Patient Name: Date of Service: Holly Hora Herrera. 03/19/2023 2:30 PM Medical Record Number: [id number] Patient Account Number: [id number] Date of Birth/Sex: Treating RN: February 27, 1966 (57 y.o. Orvan Falconer Primary Care Leeloo Silverthorne: Dionisio David Other Clinician: Referring Archibald Marchetta: Treating Myers Tutterow/Extender: Barbee Shropshire, Crystal Weeks in Treatment: 0 Encounter Discharge Information Items Post Procedure Vitals Discharge Condition: Stable Temperature (F): 98.2 Ambulatory Status: Ambulatory Pulse (bpm): 82 Discharge Destination: Home Respiratory Rate (breaths/min): 18 Transportation: Private Auto Blood Pressure (mmHg): 111/68 Accompanied By: self Schedule Follow-up Appointment: Yes Clinical Summary of Care: Holly Herrera, Holly Herrera ([id number]) [id number] Page 3 of 8 Electronic Signature(s) Signed: 03/19/2023 3:50:04 PM By: Carlene Coria RN Entered By: Carlene Coria on 03/19/2023 15:24:04 -------------------------------------------------------------------------------- Lower Extremity Assessment Details Patient Name: Date of Service: Holly Herrera, Holly Herrera 03/19/2023 2:30 PM Medical Record Number: [id number] Patient Account Number: [id number] Date of Birth/Sex: Treating RN: 1966-03-09 (57 y.o. Orvan Falconer Primary Care Journey Castonguay: Dionisio David Other  Clinician: Referring Kathlyn Leachman: Treating Djuna Frechette/Extender: Barbee Shropshire, Crystal Weeks in Treatment: 0 Electronic Signature(s) Signed: 03/19/2023 3:50:04 PM By: Carlene Coria RN Entered By: Carlene Coria on 03/19/2023 14:44:11 -------------------------------------------------------------------------------- Multi Wound Chart Details Patient Name: Date of Service: Holly Herrera, Holly Herrera 03/19/2023 2:30 PM Medical Record Number: [id number] Patient Account Number: [id number] Date of Birth/Sex: Treating RN: [birth year] (57 y.o. Orvan Falconer Primary Care Tamu Golz: Dionisio David Other Clinician: Referring Aiya Keach: Treating Curly Mackowski/Extender: Barbee Shropshire, Crystal Weeks in Treatment: 0 Vital Signs Height(in): 65 Pulse(bpm): 82 Weight(lbs): 179 Blood Pressure(mmHg): 111/68 Body Mass Index(BMI): 29.8 Temperature(F): 98.2 Respiratory Rate(breaths/min): 18 [1:Photos:] [N/A:N/A] Right Forearm N/A N/A Wound Location: Thermal Burn N/A N/A Wounding Event: 2nd degree Burn N/A N/A Primary Etiology: Hypertension, Type II Diabetes N/A N/A Comorbid History: 03/05/2023 N/A N/A Date Acquired: 0 N/A N/A Weeks of Treatment: Open N/A N/A Wound Status: No N/A N/A Wound Recurrence: 27x11x0.1 N/A N/A Measurements L x W x Herrera (cm) 233.263 N/A N/A A (cm) : rea 23.326 N/A N/A Volume (cm) : Partial Thickness N/A N/A Classification: Medium N/A N/A Exudate A mount: Serosanguineous N/A N/A Exudate Type: red, brown N/A N/A Exudate Color: Medium (34-66%) N/A N/A Granulation A mount: Red N/A N/A Granulation Quality: Medium (34-66%) N/A N/A Necrotic A mount: Fat Layer (Subcutaneous Tissue): Yes N/A N/A Exposed Structures: Fascia: No Tendon: No Muscle: No Joint: No Bone: No None N/A N/A Epithelialization: Burn Debridement: Small N/A N/A Procedures Performed: MATIGAN, PUJOLS ([id number]) [id number] Page 4 of 8 Treatment Notes Wound #1 (Forearm) Wound  Laterality: Right Cleanser Peri-Wound Care AandD Ointment Discharge Instruction: Apply AandD Ointment as directed Topical Primary Dressing Xeroform 5x9-HBD (in/in) Discharge Instruction: Apply Xeroform 5x9-HBD (in/in) as directed Secondary Dressing ABD Pad 5x9 (in/in) Discharge Instruction: Cover with ABD pad Kerlix 4.5 x 4.1 (in/yd) Discharge Instruction: Apply Kerlix 4.5 x 4.1 (in/yd) as instructed Secured With Medipore T - 51M Medipore H Soft Cloth Surgical T ape ape, 2x2 (in/yd) Compression Wrap Compression Stockings Add-Ons Electronic Signature(s) Signed: 03/19/2023 3:50:04 PM By: Carlene Coria RN Entered By: Carlene Coria on 03/19/2023 15:25:29 -------------------------------------------------------------------------------- [place] Details Patient Name: Date of Service: Holly Hora Herrera. 03/19/2023 2:30 PM Medical Record Number: [id number] Patient Account Number: [id number] Date of Birth/Sex: Treating RN: [birth year] (57 y.o. Orvan Falconer Primary Care Mackynzie Woolford: Dionisio David Other Clinician: Referring Kittie Krizan: Treating Amair Shrout/Extender: Barbee Shropshire, Crystal  Weeks in Treatment: 0 Active Inactive Necrotic Tissue Nursing Diagnoses: Knowledge deficit related to management of necrotic/devitalized tissue Goals: Necrotic/devitalized tissue will be minimized in the wound bed Date Initiated: 03/19/2023 Target Resolution Date: 04/19/2023 Goal Status: Active Interventions: Assess patient pain level pre-, during and post procedure and prior to discharge Provide education on necrotic tissue and debridement process Notes: Wound/Skin Impairment Nursing Diagnoses: Knowledge deficit related to ulceration/compromised skin integrity Goals: Patient/caregiver will verbalize understanding of skin care regimen SAMHITA, CHAMPEAU ([id number]) [id number] Page 5 of 8 Date Initiated: 03/19/2023 Target Resolution Date: 04/19/2023 Goal Status:  Active Ulcer/skin breakdown will have a volume reduction of 30% by week 4 Date Initiated: 03/19/2023 Target Resolution Date: 04/19/2023 Goal Status: Active Ulcer/skin breakdown will have a volume reduction of 50% by week 8 Date Initiated: 03/19/2023 Target Resolution Date: 05/19/2023 Goal Status: Active Ulcer/skin breakdown will have a volume reduction of 80% by week 12 Date Initiated: 03/19/2023 Target Resolution Date: 06/19/2023 Goal Status: Active Ulcer/skin breakdown will heal within 14 weeks Date Initiated: 03/19/2023 Target Resolution Date: 07/19/2023 Goal Status: Active Interventions: Assess patient/caregiver ability to obtain necessary supplies Assess patient/caregiver ability to perform ulcer/skin care regimen upon admission and as needed Assess ulceration(s) every visit Notes: Electronic Signature(s) Signed: 03/19/2023 3:50:04 PM By: Carlene Coria RN Entered By: Carlene Coria on 03/19/2023 15:15:54 -------------------------------------------------------------------------------- Pain Assessment Details Patient Name: Date of Service: Holly Herrera, Holly Herrera 03/19/2023 2:30 PM Medical Record Number: [id number] Patient Account Number: [id number] Date of Birth/Sex: Treating RN: 11/01/1966 (57 y.o. Orvan Falconer Primary Care Alayha Babineaux: Dionisio David Other Clinician: Referring Arya Boxley: Treating Donia Yokum/Extender: Barbee Shropshire, Crystal Weeks in Treatment: 0 Active Problems Location of Pain Severity and Description of Pain Patient Has Paino Yes Site Locations With Dressing Change: Yes Duration of the Pain. Constant / Intermittento Constant Rate the pain. Current Pain Level: 7 Worst Pain Level: 10 Least Pain Level: 6 Tolerable Pain Level: 5 Character of Pain Describe the Pain: Aching Pain Management and Medication Current Pain Management: Medication: Yes Cold Application: No Rest: Yes Massage: No Activity: No T.E.N.S.: No Heat Application: No Leg drop or elevation: No Is  the Current Pain Management Adequate: Inadequate How does your wound impact your activities of daily livingo Sleep: No Bathing: No Holly Herrera, Holly Herrera ([id number]) [phone].pdf Page 6 of 8 Appetite: No Relationship With Others: No Bladder Continence: No Emotions: No Bowel Continence: No Work: No Toileting: No Drive: No Dressing: No Hobbies: No Electronic Signature(s) Signed: 03/19/2023 3:50:04 PM By: Carlene Coria RN Entered By: Carlene Coria on 03/19/2023 14:39:17 -------------------------------------------------------------------------------- Patient/Caregiver Education Details Patient Name: Date of Service: Holly Herrera 3/25/2024andnbsp2:30 PM Medical Record Number: [id number] Patient Account Number: [id number] Date of Birth/Gender: Treating RN: 01/06/1966 (57 y.o. Orvan Falconer Primary Care Physician: Dionisio David Other Clinician: Referring Physician: Treating Physician/Extender: Sinclair Ship in Treatment: 0 Education Assessment Education Provided To: Patient Education Topics Provided Wound/Skin Impairment: Methods: Explain/Verbal Responses: State content correctly Electronic Signature(s) Signed: 03/19/2023 3:50:04 PM By: Carlene Coria RN Entered By: Carlene Coria on 03/19/2023 15:16:05 -------------------------------------------------------------------------------- Wound Assessment Details Patient Name: Date of Service: Holly Herrera, Holly Herrera 03/19/2023 2:30 PM Medical Record Number: [id number] Patient Account Number: [id number] Date of Birth/Sex: Treating RN: 09-Oct-1966 (57 y.o. Orvan Falconer Primary Care Hiawatha Merriott: Dionisio David Other Clinician: Referring Juvon Teater: Treating Dametrius Sanjuan/Extender: Barbee Shropshire, Crystal Weeks in Treatment: 0 Wound Status Wound Number: 1 Primary Etiology: 2nd degree Burn Wound Location: Right Forearm Wound Status: Open Wounding Event: Thermal Burn Comorbid History: Hypertension, Type II  Diabetes Date Acquired: 03/05/2023 Weeks Of Treatment: 0 Clustered Wound: No Photos MAKIBA, BUTTERWORTH ([id number]) [id number] Page 7 of 8 Wound Measurements Length: (cm) 27 Width: (cm) 11 Depth: (cm) 0.1 Area: (cm) 233.263 Volume: (cm) 23.326 % Reduction in Area: % Reduction in Volume: Epithelialization: None Tunneling: No Undermining: No Wound Description Classification: Partial Thickness Exudate Amount: Medium Exudate Type: Serosanguineous Exudate Color: red, brown Foul Odor After Cleansing: No Slough/Fibrino Yes Wound Bed Granulation Amount: Medium (34-66%) Exposed Structure Granulation Quality: Red Fascia Exposed: No Necrotic Amount: Medium (34-66%) Fat Layer (Subcutaneous Tissue) Exposed: Yes Necrotic Quality: Adherent Slough Tendon Exposed: No Muscle Exposed: No Joint Exposed: No Bone Exposed: No Treatment Notes Wound #1 (Forearm) Wound Laterality: Right Cleanser Peri-Wound Care AandD Ointment Discharge Instruction: Apply AandD Ointment as directed Topical Primary Dressing Xeroform 5x9-HBD (in/in) Discharge Instruction: Apply Xeroform 5x9-HBD (in/in) as directed Secondary Dressing ABD Pad 5x9 (in/in) Discharge Instruction: Cover with ABD pad Kerlix 4.5 x 4.1 (in/yd) Discharge Instruction: Apply Kerlix 4.5 x 4.1 (in/yd) as instructed Secured With Medipore T - 1M Medipore H Soft Cloth Surgical T ape ape, 2x2 (in/yd) Compression Wrap Compression Stockings Add-Ons Electronic Signature(s) Signed: 03/19/2023 3:50:04 PM By: Carlene Coria RN Entered By: Carlene Coria on 03/19/2023 14:49:55 -------------------------------------------------------------------------------- Vitals Details Patient Name: Date of Service: Holly Hora Herrera. 03/19/2023 2:30 PM Medical Record Number: [id number] Patient Account Number: [id number] Date of Birth/Sex: Treating RN: May 21, 1966 (57 y.o. Orvan Falconer Primary Care Miciah Covelli: Dionisio David Other  Clinician: Referring Sherrita Riederer: Treating Graycen Degan/Extender: Barbee Shropshire, Crystal Weeks in Treatment: 0 Vital Signs Time Taken: 14:39 Temperature (F): 98.2 Height (in): 65 Pulse (bpm): 82 Source: Stated Respiratory Rate (breaths/min): 18 Weight (lbs): 179 Blood Pressure (mmHg): 111/68 CHAQUANA, LEDUFF Herrera ([id number]) [phone].pdf Page 8 of 8 Source: Stated Reference Range: 80 - 120 mg / dl Body Mass Index (BMI): 29.8 Electronic Signature(s) Signed: 03/19/2023 3:50:04 PM By: Carlene Coria RN Entered By: Carlene Coria on 03/19/2023 14:39:45

## 2023-03-19 NOTE — Progress Notes (Signed)
Holly Herrera, Holly Herrera ([id number]) [phone] [id number] Page 1 of 4 Visit Report for 03/19/2023 Abuse Risk Screen Details Patient Name: Date of Service: Holly Herrera, Holly Herrera 03/19/2023 2:30 PM Medical Record Number: [id number] Patient Account Number: [id number] Date of Birth/Sex: Treating RN: 1966/05/20 (57 y.o. Orvan Falconer Primary Care Juanpablo Ciresi: Dionisio David Other Clinician: Referring Reyli Schroth: Treating Telissa Palmisano/Extender: Barbee Shropshire, Crystal Weeks in Treatment: 0 Abuse Risk Screen Items Answer ABUSE RISK SCREEN: Has anyone close to you tried to hurt or harm you recentlyo No Do you feel uncomfortable with anyone in your familyo No Has anyone forced you do things that you didnt want to doo No Electronic Signature(s) Signed: 03/19/2023 3:50:04 PM By: Carlene Coria RN Entered By: Carlene Coria on 03/19/2023 14:42:28 -------------------------------------------------------------------------------- Activities of Daily Living Details Patient Name: Date of Service: Holly Herrera, Holly Herrera 03/19/2023 2:30 PM Medical Record Number: [id number] Patient Account Number: [id number] Date of Birth/Sex: Treating RN: 1966-02-20 (57 y.o. Orvan Falconer Primary Care Khiem Gargis: Dionisio David Other Clinician: Referring Meighan Treto: Treating Marice Guidone/Extender: Barbee [place]ro[place], Crystal Weeks in Treatment: 0 Activities of Daily Living Items Answer Activities of Daily Living (Please select one for each item) Drive Automobile Completely Able T Medications ake Completely Able Use T elephone Completely Able Care for Appearance Completely Able Use T oilet Completely Able Bath / Shower Completely Able Dress Self Completely Able Feed Self Completely Able Walk Completely Able Get In / Out Bed Completely Able Housework Completely Able Prepare Meals Completely [place] for Self Completely Able Electronic Signature(s) Signed: 03/19/2023 3:50:04 PM By: Carlene Coria  RN Entered By: Carlene Coria on 03/19/2023 14:42:50 -------------------------------------------------------------------------------- Education Screening Details Patient Name: Date of Service: Holly Hora Herrera. 03/19/2023 2:30 PM Medical Record Number: [id number] Patient Account Number: [id number] Date of Birth/Sex: Treating RN: Jun 26, 1966 (57 y.o. Orvan Falconer Primary Care Carrson Lightcap: Dionisio David Other Clinician: Referring Cassidie Veiga: Treating Trameka Dorough/Extender: Barbee [place]ro[place], Crystal Weeks in Treatment: 0 [place] Herrera ([id number]) [id number] Page 2 of 4 Primary Learner Assessed: Patient Learning Preferences/Education Level/Primary Language Learning Preference: Explanation Highest Education Level: College or Above Preferred Language: English Cognitive Barrier Language Barrier: No Translator Needed: No Memory Deficit: No Emotional Barrier: No Cultural/Religious Beliefs Affecting Medical Care: No Physical Barrier Impaired Vision: No Impaired Hearing: No Decreased Hand dexterity: No Knowledge/Comprehension Knowledge Level: Medium Comprehension Level: Medium Ability to understand written instructions: Medium Ability to understand verbal instructions: Medium Motivation Anxiety Level: Anxious Cooperation: Cooperative Education Importance: Acknowledges Need Interest in Health Problems: Asks Questions Perception: Coherent Willingness to Engage in Self-Management High Activities: Readiness to Engage in Self-Management High Activities: Electronic Signature(s) Signed: 03/19/2023 3:50:04 PM By: Carlene Coria RN Entered By: Carlene Coria on 03/19/2023 14:43:18 -------------------------------------------------------------------------------- Fall Risk Assessment Details Patient Name: Date of Service: Holly Hora Herrera. 03/19/2023 2:30 PM Medical Record Number: [id number] Patient Account Number: [id number] Date of Birth/Sex: Treating RN: 16-Jul-1966  (57 y.o. Orvan Falconer Primary Care Varvara Legault: Dionisio David Other Clinician: Referring Rafeal Skibicki: Treating Derl Abalos/Extender: Barbee [place], Crystal Weeks in Treatment: 0 Fall Risk Assessment Items Have you had 2 or more falls in the last 12 monthso 0 No Have you had any fall that resulted in injury in the last 12 monthso 0 No FALLS RISK SCREEN History of falling - immediate or within 3 months 0 No Secondary diagnosis (Do you have 2 or more medical diagnoseso) 0 No Ambulatory aid None/bed rest/wheelchair/nurse 0 Yes Crutches/cane/walker 0 No Furniture 0 No Intravenous therapy Access/Saline/Heparin Lock 0 No Gait/Transferring Normal/ bed rest/  wheelchair 0 Yes Weak (short steps with or without shuffle, stooped but able to lift head while walking, may seek 0 No support from furniture) Impaired (short steps with shuffle, may have difficulty arising from chair, head down, impaired 0 No balance) Mental Status Oriented to own ability 0 Yes Overestimates or forgets limitations 0 No Risk Level: Low Risk Score: 0 Holly Herrera, Holly Herrera ([id number]) [phone] [id number] Page 3 of 4 Electronic Signature(s) -------------------------------------------------------------------------------- Foot Assessment Details Patient Name: Date of Service: Holly Herrera, Holly Herrera 03/19/2023 2:30 PM Medical Record Number: [id number] Patient Account Number: [id number] Date of Birth/Sex: Treating RN: Mar 07, 1966 (58 y.o. Orvan Falconer Primary Care Amayra Kiedrowski: Dionisio David Other Clinician: Referring Marigny Borre: Treating Shakeeta Godette/Extender: Barbee [place]ro[place], Crystal Weeks in Treatment: 0 Foot Assessment Items Site Locations + = Sensation present, - = Sensation absent, C = Callus, U = Ulcer R = Redness, W = Warmth, M = Maceration, PU = Pre-ulcerative lesion F = Fissure, S = Swelling, Herrera = Dryness Assessment Right: Left: Other Deformity: No No Prior Foot Ulcer: No No Prior Amputation: No  No Charcot Joint: No No Ambulatory Status: Ambulatory Without Help Gait: Steady Electronic Signature(s) Signed: 03/19/2023 3:50:04 PM By: Carlene Coria RN Entered By: Carlene Coria on 03/19/2023 14:43:58 -------------------------------------------------------------------------------- Nutrition Risk Screening Details Patient Name: Date of Service: Holly Herrera, Holly Herrera 03/19/2023 2:30 PM Medical Record Number: [id number] Patient Account Number: [id number] Date of Birth/Sex: Treating RN: 1966/10/14 (57 y.o. Orvan Falconer Primary Care Nariah Morgano: Dionisio David Other Clinician: Referring Sejla Marzano: Treating Teka Chanda/Extender: Barbee [place], Crystal Weeks in Treatment: 0 Height (in): 65 Weight (lbs): 179 Body Mass Index (BMI): 29.8 Holly Herrera, Holly Herrera ([id number]) [phone] [id number] Page 4 of 4 Nutrition Risk Screening Items Score Screening NUTRITION RISK SCREEN: I have an illness or condition that made me change the kind and/or amount of food I eat 2 Yes I eat fewer than two meals per day 0 No I eat few fruits and vegetables, or milk products 0 No I have three or more drinks of beer, liquor or wine almost every day 0 No I have tooth or mouth problems that make it hard for me to eat 0 No I don't always have enough money to buy the food I need 0 No I eat alone most of the time 0 No I take three or more different prescribed or over-the-counter drugs a day 1 Yes Without wanting to, I have lost or gained 10 pounds in the last six months 0 No I am not always physically able to shop, cook and/or feed myself 0 No Nutrition Protocols Good Risk Protocol Moderate Risk Protocol 0 Provide education on nutrition High Risk Proctocol Risk Level: Moderate Risk Score: 3 Electronic Signature(s) Signed: 03/19/2023 3:50:04 PM By: Carlene Coria RN Entered By: Carlene Coria on 03/19/2023 14:43:51
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
## 2023-03-26 ENCOUNTER — Ambulatory Visit: Payer: Self-pay | Admitting: Physician Assistant

## 2023-03-27 ENCOUNTER — Ambulatory Visit: Payer: Self-pay | Admitting: Physician Assistant

## 2023-03-29 ENCOUNTER — Encounter: Payer: BLUE CROSS/BLUE SHIELD | Attending: Physician Assistant | Admitting: Physician Assistant

## 2023-03-29 DIAGNOSIS — T22211A Burn of second degree of right forearm, initial encounter: Secondary | ICD-10-CM | POA: Diagnosis present

## 2023-03-29 DIAGNOSIS — I10 Essential (primary) hypertension: Secondary | ICD-10-CM | POA: Insufficient documentation

## 2023-03-29 DIAGNOSIS — Z09 Encounter for follow-up examination after completed treatment for conditions other than malignant neoplasm: Secondary | ICD-10-CM | POA: Insufficient documentation

## 2023-03-29 DIAGNOSIS — X020XXA Exposure to flames in controlled fire in building or structure, initial encounter: Secondary | ICD-10-CM | POA: Insufficient documentation

## 2023-03-29 DIAGNOSIS — E11622 Type 2 diabetes mellitus with other skin ulcer: Secondary | ICD-10-CM | POA: Insufficient documentation
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
## 2023-03-29 NOTE — Progress Notes (Addendum)
SUNG, RENTON ([id number]) [id number] Page 1 of 7 Visit Report for 03/29/2023 Arrival Information Details Patient Name: Date of Service: APPLE, DEARMAS 03/29/2023 8:15 A M Medical Record Number: [id number] Patient Account Number: [id number] Date of Birth/Sex: Treating RN: [birth year] (57 y.o. Skip Mayer Primary Care Donnis Phaneuf: Thad Ranger Other Clinician: Betha Loa Referring Mouhamed Glassco: Treating Kathryne Ramella/Extender: Jillene Bucks, Crystal Weeks in Treatment: 1 Visit Information History Since Last Visit All ordered tests and consults were completed: No Patient Arrived: Ambulatory Added or deleted any medications: No Arrival Time: 08:20 Any new allergies or adverse reactions: No Transfer Assistance: None Had a fall or experienced change in No Patient Identification Verified: Yes activities of daily living that may affect Secondary Verification Process Completed: Yes risk of falls: Patient Requires Transmission-Based Precautions: No Signs or symptoms of abuse/neglect since last visito No Patient Has Alerts: No Hospitalized since last visit: No Implantable device outside of the clinic excluding No cellular tissue based products placed in the center since last visit: Has Dressing in Place as Prescribed: Yes Pain Present Now: No Electronic Signature(s) Signed: 03/30/2023 11:43:38 AM By: Betha Loa Entered By: Betha Loa on 03/29/2023 08:21:55 -------------------------------------------------------------------------------- Clinic Level of Care Assessment Details Patient Name: Date of Service: DASHANA, GUIZAR 03/29/2023 8:15 A M Medical Record Number: [id number] Patient Account Number: [id number] Date of Birth/Sex: Treating RN: 01-20-1966 (57 y.o. Cathlean Cower, Kim Primary Care Daielle Melcher: Thad Ranger Other Clinician: Betha Loa Referring Holly Iannaccone: Treating Marvens Hollars/Extender: Jillene Bucks, Crystal Weeks in Treatment: 1 Clinic Level of  Care Assessment Items TOOL 4 Quantity Score []  - 0 Use when only an EandM is performed on FOLLOW-UP visit ASSESSMENTS - Nursing Assessment / Reassessment X- 1 10 Reassessment of Co-morbidities (includes updates in patient status) X- 1 5 Reassessment of Adherence to Treatment Plan ASSESSMENTS - Wound and Skin A ssessment / Reassessment X - Simple Wound Assessment / Reassessment - one wound 1 5 []  - 0 Complex Wound Assessment / Reassessment - multiple wounds []  - 0 Dermatologic / Skin Assessment (not related to wound area) ASSESSMENTS - Focused Assessment []  - 0 Circumferential Edema Measurements - multi extremities []  - 0 Nutritional Assessment / Counseling / Intervention []  - 0 Lower Extremity Assessment (monofilament, tuning fork, pulses) []  - 0 Peripheral Arterial Disease Assessment (using hand held doppler) ASSESSMENTS - Ostomy and/or Continence Assessment and Care []  - 0 Incontinence Assessment and Management []  - 0 Ostomy Care Assessment and Management (repouching, etc.) PROCESS - Coordination of Care NAOKO, DIPERNA D ([id number]) [id number] Page 2 of 7 X- 1 15 Simple Patient / Family Education for ongoing care []  - 0 Complex (extensive) Patient / Family Education for ongoing care []  - 0 Staff obtains Chiropractor, Records, T Results / Process Orders est []  - 0 Staff telephones HHA, Nursing Homes / Clarify orders / etc []  - 0 Routine Transfer to another Facility (non-emergent condition) []  - 0 Routine Hospital Admission (non-emergent condition) []  - 0 New Admissions / Manufacturing engineer / Ordering NPWT Apligraf, etc. , []  - 0 Emergency Hospital Admission (emergent condition) X- 1 10 Simple Discharge Coordination []  - 0 Complex (extensive) Discharge Coordination PROCESS - Special Needs []  - 0 Pediatric / Minor Patient Management []  - 0 Isolation Patient Management []  - 0 Hearing / Language / Visual special needs []  -  0 Assessment of Community assistance (transportation, D/C planning, etc.) []  - 0 Additional assistance / Altered mentation []  - 0 Support Surface(s) Assessment (bed, cushion, seat, etc.) INTERVENTIONS - Wound Cleansing /  Measurement X - Simple Wound Cleansing - one wound 1 5 []  - 0 Complex Wound Cleansing - multiple wounds X- 1 5 Wound Imaging (photographs - any number of wounds) []  - 0 Wound Tracing (instead of photographs) []  - 0 Simple Wound Measurement - one wound []  - 0 Complex Wound Measurement - multiple wounds INTERVENTIONS - Wound Dressings []  - 0 Small Wound Dressing one or multiple wounds []  - 0 Medium Wound Dressing one or multiple wounds []  - 0 Large Wound Dressing one or multiple wounds X- 1 5 Application of Medications - topical []  - 0 Application of Medications - injection INTERVENTIONS - Miscellaneous []  - 0 External ear exam []  - 0 Specimen Collection (cultures, biopsies, blood, body fluids, etc.) []  - 0 Specimen(s) / Culture(s) sent or taken to Lab for analysis []  - 0 Patient Transfer (multiple staff / Michiel SitesHoyer Lift / Similar devices) []  - 0 Simple Staple / Suture removal (25 or less) []  - 0 Complex Staple / Suture removal (26 or more) []  - 0 Hypo / Hyperglycemic Management (close monitor of Blood Glucose) []  - 0 Ankle / Brachial Index (ABI) - do not check if billed separately X- 1 5 Vital Signs Has the patient been seen at the hospital within the last three years: Yes Total Score: 65 Level Of Care: New/Established - Level 2 Electronic Signature(s) Signed: 03/30/2023 11:43:38 AM By: Brendia SacksVenable, Angie Spivey, Briele D ([id number]) [id number] Page 3 of 7 Entered By: Betha LoaVenable, Angie on 03/29/2023 08:41:48 -------------------------------------------------------------------------------- Encounter Discharge Information Details Patient Name: Date of Service: Ophelia CharterCRISP, Ilyanna D. 03/29/2023 8:15 A M Medical Record Number:  [id number] Patient Account Number: [id number] Date of Birth/Sex: Treating RN: 07/12/1966 (57 y.o. Skip MayerF) Woody, Kim Primary Care Merion Caton: Thad RangerKing, Crystal Other Clinician: Betha LoaVenable, Angie Referring Koa Zoeller: Treating Cristian Davitt/Extender: Servando SalinaStone, Hoyt King, Crystal Weeks in Treatment: 1 Encounter Discharge Information Items Discharge Condition: Stable Ambulatory Status: Ambulatory Discharge Destination: Home Transportation: Private Auto Accompanied By: Clearnce Sorrelsef Schedule Follow-up Appointment: Yes Clinical Summary of Care: Electronic Signature(s) Signed: 03/30/2023 11:43:38 AM By: Betha LoaVenable, Angie Entered By: Betha LoaVenable, Angie on 03/29/2023 08:46:19 -------------------------------------------------------------------------------- Lower Extremity Assessment Details Patient Name: Date of Service: Ophelia CharterCRISP, Daijanae D. 03/29/2023 8:15 A M Medical Record Number: [id number] Patient Account Number: [id number] Date of Birth/Sex: Treating RN: 07/12/1966 (57 y.o. Skip MayerF) Woody, Kim Primary Care Vandy Tsuchiya: Thad RangerKing, Crystal Other Clinician: Betha LoaVenable, Angie Referring Zuleyma Scharf: Treating Calyb Mcquarrie/Extender: Jethro BastosStone, Hoyt King, Crystal Weeks in Treatment: 1 Electronic Signature(s) Signed: 03/30/2023 9:18:23 AM By: Elliot GurneyWoody, BSN, RN, CWS, Kim RN, BSN Signed: 03/30/2023 11:43:38 AM By: Betha LoaVenable, Angie Entered By: Betha LoaVenable, Angie on 03/29/2023 08:39:21 -------------------------------------------------------------------------------- Multi Wound Chart Details Patient Name: Date of Service: Janace ArisCRISP, Analeah D. 03/29/2023 8:15 A M Medical Record Number: [id number] Patient Account Number: [id number] Date of Birth/Sex: Treating RN: 07/12/1966 (57 y.o. Skip MayerF) Woody, Kim Primary Care Croix Presley: Thad RangerKing, Crystal Other Clinician: Betha LoaVenable, Angie Referring Jacoya Bauman: Treating Demere Dotzler/Extender: Jillene BucksStone, Hoyt King, Crystal Weeks in Treatment: 1 Vital Signs Height(in): 65 Pulse(bpm): 67 Weight(lbs): 179 Blood Pressure(mmHg): 106/63 Body Mass Index(BMI):  29.8 Temperature(F): 97.7 Respiratory Rate(breaths/min): 16 [1:Photos:] [N/A:N/A] Right Forearm N/A N/A Wound Location: Thermal Burn N/A N/A Wounding Event: 2nd degree Burn N/A N/A Primary Etiology: Hypertension, Type II Diabetes N/A N/A Comorbid History: 03/05/2023 N/A N/A Date Acquired: 1 N/A N/A Weeks of Treatment: Healed - Epithelialized N/A N/A Wound Status: No N/A N/A Wound Recurrence: 0x0x0 N/A N/A Measurements L x W x D (cm) 0 N/A N/A A (cm) : rea 0 N/A N/A Volume (cm) : 100.00% N/A N/A % Reduction  in A rea: 100.00% N/A N/A % Reduction in Volume: Partial Thickness N/A N/A Classification: None Present N/A N/A Exudate A mount: None Present (0%) N/A N/A Granulation A mount: None Present (0%) N/A N/A Necrotic A mount: Fat Layer (Subcutaneous Tissue): Yes N/A N/A Exposed Structures: Fascia: No Tendon: No Muscle: No Joint: No Bone: No Large (67-100%) N/A N/A Epithelialization: Treatment Notes Electronic Signature(s) Signed: 03/30/2023 11:43:38 AM By: Betha Loa Entered By: Betha Loa on 03/29/2023 08:39:24 -------------------------------------------------------------------------------- Multi-Disciplinary Care Plan Details Patient Name: Date of Service: Janace Aris D. 03/29/2023 8:15 A M Medical Record Number: [id number] Patient Account Number: [id number] Date of Birth/Sex: Treating RN: June 11, 1966 (57 y.o. Skip Mayer Primary Care Rockey Guarino: Thad Ranger Other Clinician: Betha Loa Referring Wauneta Silveria: Treating Consuelo Suthers/Extender: Jethro Bastos Weeks in Treatment: 1 Active Inactive Electronic Signature(s) Signed: 03/30/2023 9:18:23 AM By: Elliot Gurney BSN, RN, CWS, Kim RN, BSN Signed: 03/30/2023 11:43:38 AM By: Betha Loa Entered By: Betha Loa on 03/29/2023 12:03:02 -------------------------------------------------------------------------------- Pain Assessment Details Patient Name: Date of Service: EULLA, KOCHANOWSKI  03/29/2023 8:15 A M Medical Record Number: [id number] Patient Account Number: [id number] Date of Birth/Sex: Treating RN: Apr 13, 1966 (57 y.o. Skip Mayer Primary Care Lacoya Wilbanks: Thad Ranger Other Clinician: Betha Loa Referring Alandis Bluemel: Treating Keiana Tavella/Extender: Jillene Bucks, Crystal Weeks in Treatment: 1 Active Problems Location of Pain Severity and Description of Pain Patient Has Paino No Site Locations Daaiyah, Baumert Wilder D ([id number]) [id number] Page 5 of 7 Pain Management and Medication Current Pain Management: Electronic Signature(s) Signed: 03/30/2023 9:18:23 AM By: Elliot Gurney, BSN, RN, CWS, Kim RN, BSN Signed: 03/30/2023 11:43:38 AM By: Betha Loa Entered By: Betha Loa on 03/29/2023 08:24:11 -------------------------------------------------------------------------------- Patient/Caregiver Education Details Patient Name: Date of Service: SHANDI, GODFREY 4/4/2024andnbsp8:15 A M Medical Record Number: [id number] Patient Account Number: [id number] Date of Birth/Gender: Treating RN: 06-Nov-1966 (57 y.o. Skip Mayer Primary Care Physician: Thad Ranger Other Clinician: Betha Loa Referring Physician: Treating Physician/Extender: Servando Salina in Treatment: 1 Education Assessment Education Provided To: Patient Education Topics Provided Wound/Skin Impairment: Handouts: Other: Wound has healed. Please call if any further issues arise Methods: Explain/Verbal Responses: State content correctly Electronic Signature(s) Signed: 03/30/2023 11:43:38 AM By: Betha Loa Entered By: Betha Loa on 03/29/2023 08:45:35 -------------------------------------------------------------------------------- Wound Assessment Details Patient Name: Date of Service: JERRE, DIGUGLIELMO 03/29/2023 8:15 A M Medical Record Number: [id number] Patient Account Number: [id number] Date of Birth/Sex: Treating RN: 1966-08-27 (57 y.o. Skip Mayer Primary Care Chevonne Bostrom: Thad Ranger Other Clinician: Betha Loa Referring Dacen Frayre: Treating Dewey Neukam/Extender: Jillene Bucks, Crystal Weeks in Treatment: 1 Wound Status Wound Number: 1 Primary Etiology: 2nd degree Burn Wound Location: Right Forearm Wound Status: Healed - Epithelialized Wounding Event: Thermal Burn Comorbid History: Hypertension, Type II Diabetes Date Acquired: 03/05/2023 VALORIE, MCGRORY D ([id number]) [id number] Page 6 of 7 Weeks Of Treatment: 1 Clustered Wound: No Photos Wound Measurements Length: (cm) Width: (cm) Depth: (cm) Area: (cm) Volume: (cm) 0 % Reduction in Area: 100% 0 % Reduction in Volume: 100% 0 Epithelialization: Large (67-100%) 0 Tunneling: No 0 Undermining: No Wound Description Classification: Partial Thickness Exudate Amount: None Present Foul Odor After Cleansing: No Slough/Fibrino No Wound Bed Granulation Amount: None Present (0%) Exposed Structure Necrotic Amount: None Present (0%) Fascia Exposed: No Fat Layer (Subcutaneous Tissue) Exposed: Yes Tendon Exposed: No Muscle Exposed: No Joint Exposed: No Bone Exposed: No Treatment Notes Wound #1 (Forearm) Wound Laterality: Right Cleanser Peri-Wound Care Topical Primary Dressing Secondary Dressing Secured With Compression Wrap Compression Stockings Add-Ons Electronic Signature(s)  Signed: 03/30/2023 9:18:23 AM By: Elliot GurneyWoody, BSN, RN, CWS, Kim RN, BSN Signed: 03/30/2023 11:43:38 AM By: Betha LoaVenable, Angie Entered By: Betha LoaVenable, Angie on 03/29/2023 08:39:13 -------------------------------------------------------------------------------- Vitals Details Patient Name: Date of Service: Janace ArisCRISP, Darthula D. 03/29/2023 8:15 A M Medical Record Number: [id number] Patient Account Number: [id number] Date of Birth/Sex: Treating RN: 28-May-1966 (57 y.o. Skip MayerF) Woody, Kim Primary Care Rodarius Kichline: Thad RangerKing, Crystal Other Clinician: Betha LoaVenable, Angie Referring Rochel Privett: Treating  Brenin Heidelberger/Extender: Jillene BucksStone, Hoyt King, Crystal Weeks in Treatment: 1 Koepp, HallHERRY D ([id number]) [id number] Page 7 of 7 Vital Signs Time Taken: 08:22 Temperature (F): 97.7 Height (in): 65 Pulse (bpm): 67 Weight (lbs): 179 Respiratory Rate (breaths/min): 16 Body Mass Index (BMI): 29.8 Blood Pressure (mmHg): 106/63 Reference Range: 80 - 120 mg / dl Electronic Signature(s) Signed: 03/30/2023 11:43:38 AM By: Betha LoaVenable, Angie Entered By: Betha LoaVenable, Angie on 03/29/2023 08:24:07

## 2023-04-02 ENCOUNTER — Ambulatory Visit: Payer: Self-pay | Admitting: Physician Assistant

## 2023-04-30 ENCOUNTER — Other Ambulatory Visit: Payer: Self-pay | Admitting: Nurse Practitioner

## 2023-04-30 DIAGNOSIS — Z1231 Encounter for screening mammogram for malignant neoplasm of breast: Secondary | ICD-10-CM

## 2023-05-17 ENCOUNTER — Ambulatory Visit
Admission: RE | Admit: 2023-05-17 | Discharge: 2023-05-17 | Disposition: A | Discharge status: Home / Self Care | Payer: BLUE CROSS/BLUE SHIELD | Source: Ambulatory Visit | Attending: Nurse Practitioner | Admitting: Nurse Practitioner

## 2023-05-17 DIAGNOSIS — Z1231 Encounter for screening mammogram for malignant neoplasm of breast: Secondary | ICD-10-CM | POA: Diagnosis present

## 2023-06-13 IMAGING — MG MM DIGITAL SCREENING BILAT W/ TOMO AND CAD
6 of 10 series · 6 of 30 positions shown · non-contrast
Comparison: Previous exam(s).

CLINICAL DATA: Screening.

EXAM:
DIGITAL SCREENING BILATERAL MAMMOGRAM WITH TOMOSYNTHESIS AND CAD
TECHNIQUE: Bilateral screening digital craniocaudal and mediolateral oblique
mammograms were obtained. Bilateral screening digital breast
tomosynthesis was performed. The images were evaluated with
computer-aided detection.

[R CC synth-2D]
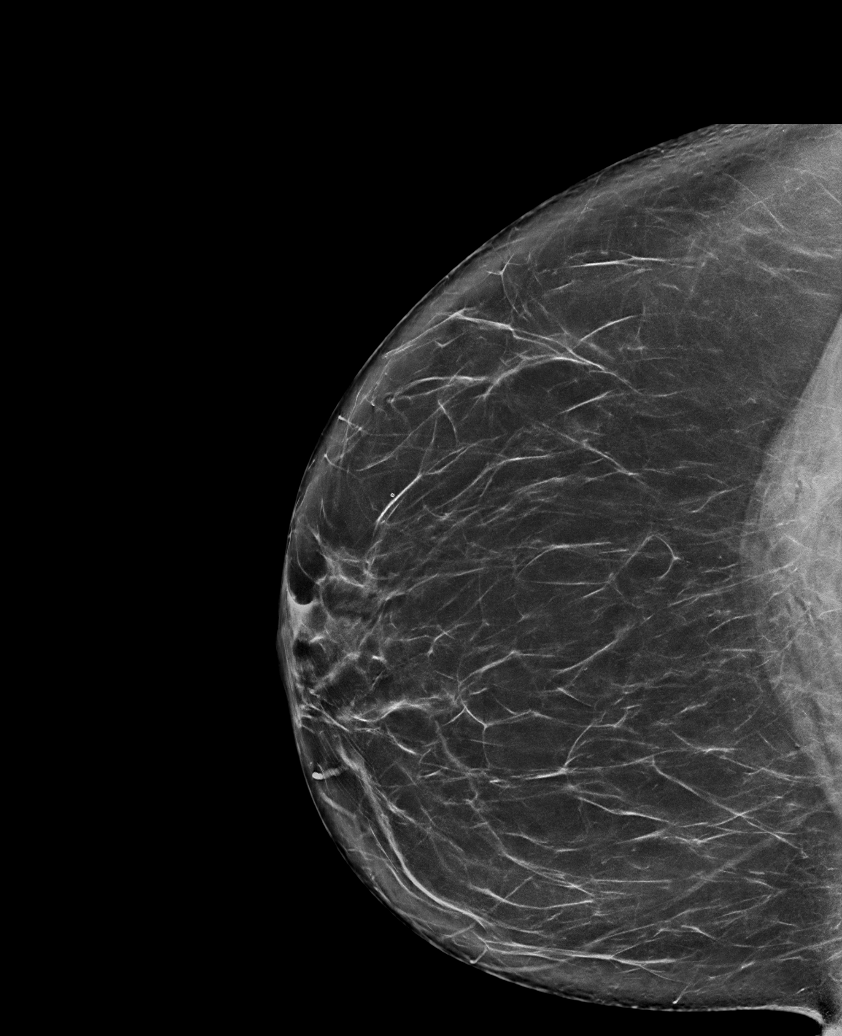

[L MLO synth-2D]
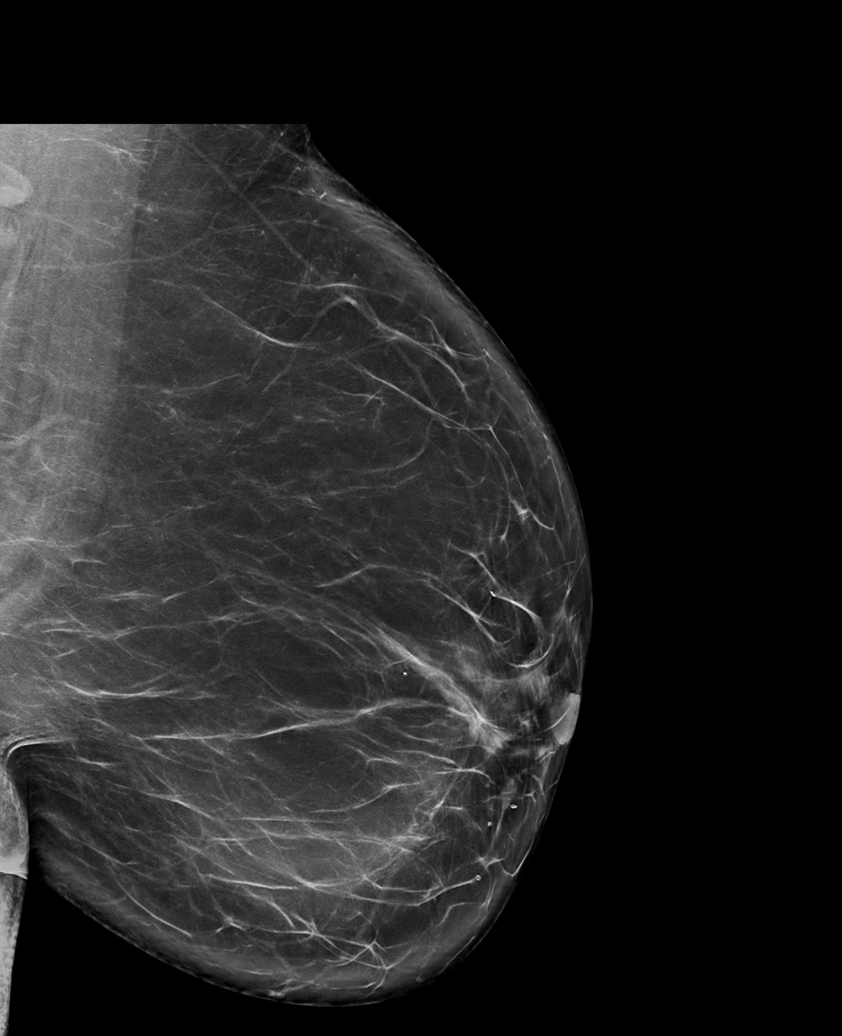

[R MLO synth-2D (1 of 2)]
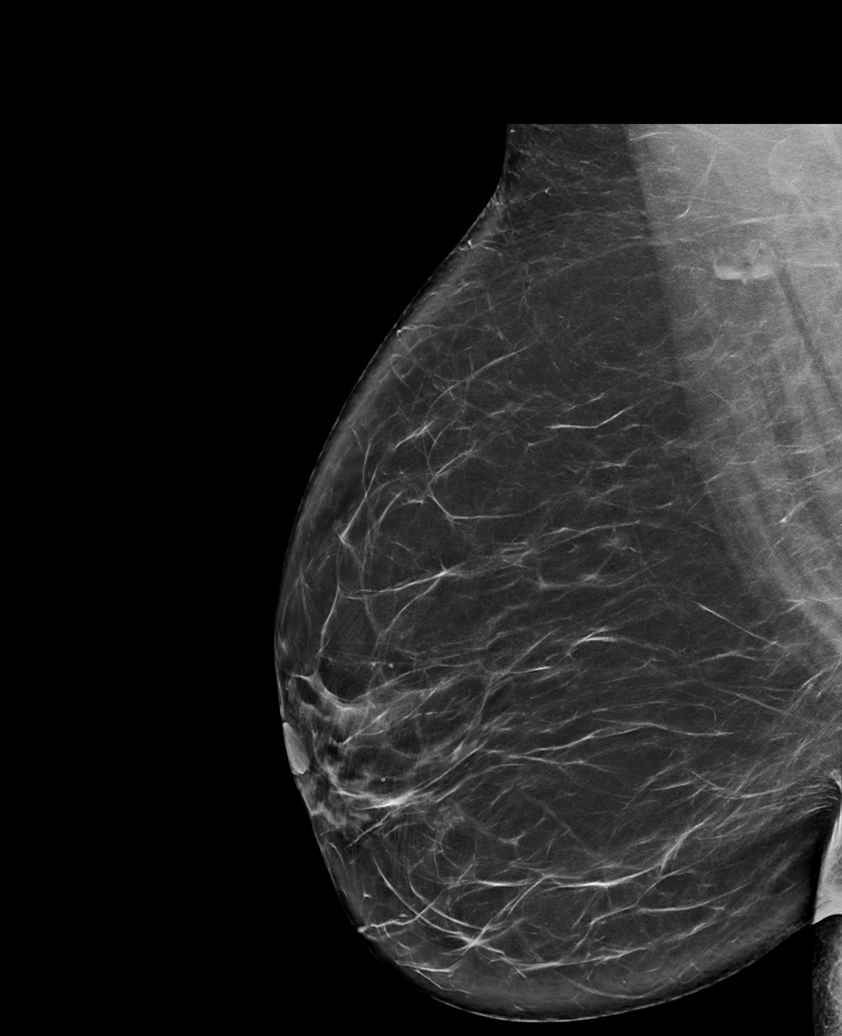

[R MLO synth-2D (2 of 2)]
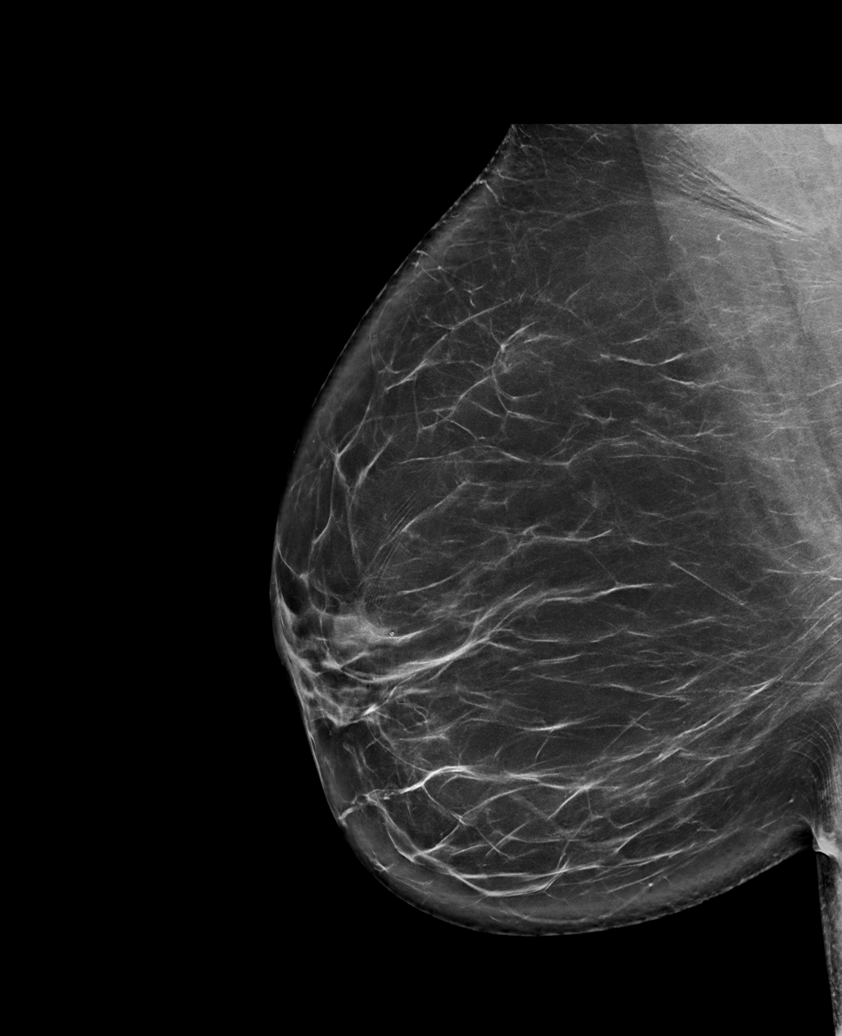

[L CC synth-2D]
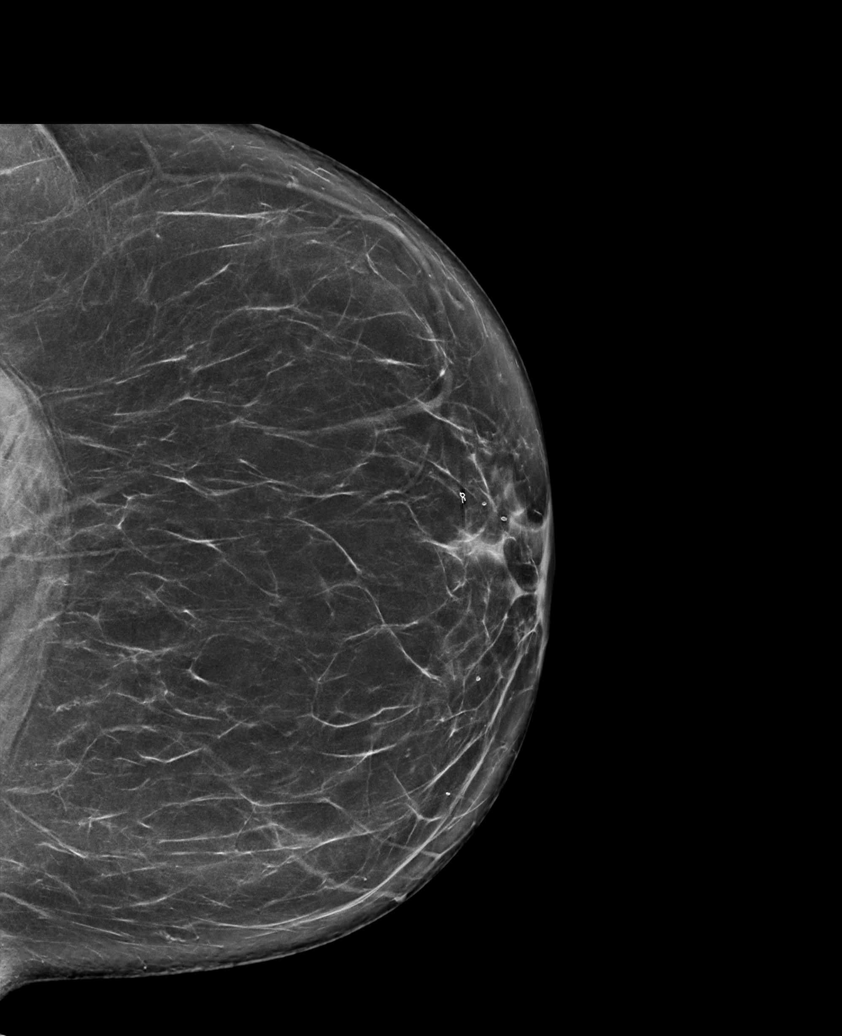

[R MLO tomo · tomo slice 49/98.0]
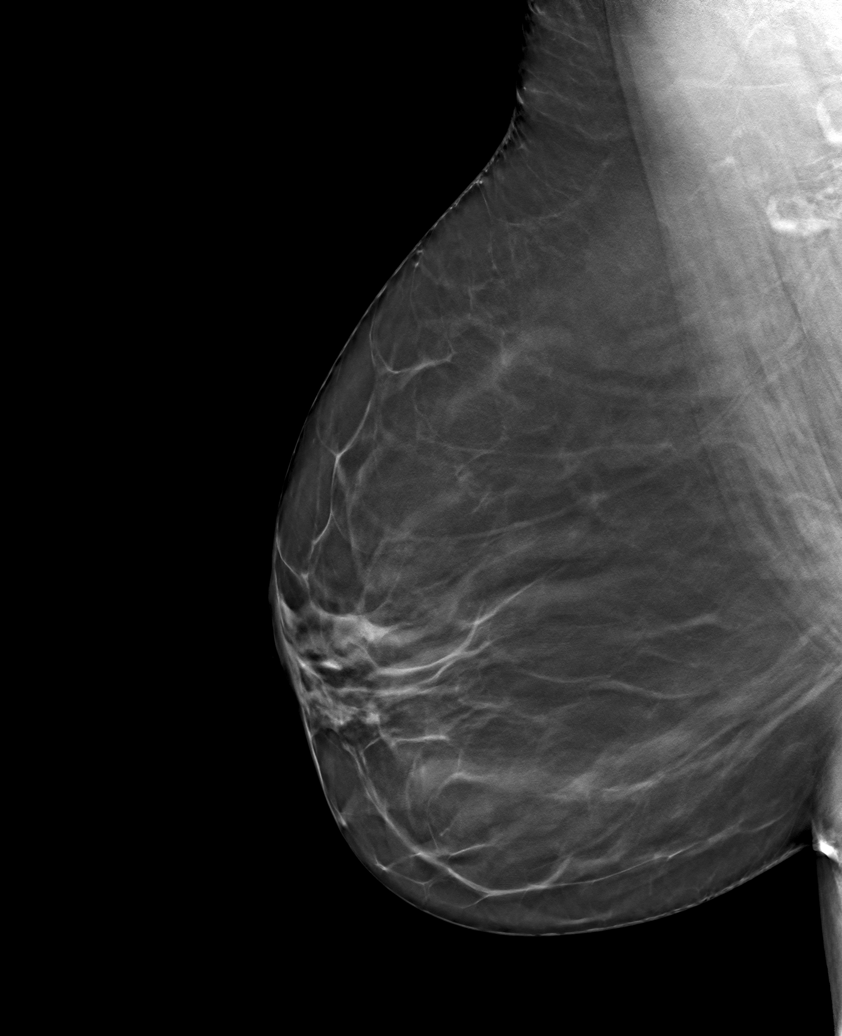

[6 of 30 positions shown; findings below may reference images not displayed]

ACR Breast Density Category b: There are scattered areas of
fibroglandular density.
FINDINGS: There are no findings suspicious for malignancy.
IMPRESSION: No mammographic evidence of malignancy. A result letter of this
screening mammogram will be mailed directly to the patient.

RECOMMENDATION:
Screening mammogram in one year. (Code:51-O-LD2)

BI-RADS CATEGORY  1: Negative.

## 2023-11-12 ENCOUNTER — Emergency Department
Admission: EM | Admit: 2023-11-12 | Discharge: 2023-11-12 | Disposition: A | Discharge status: Home / Self Care | Payer: BLUE CROSS/BLUE SHIELD | Attending: Emergency Medicine | Admitting: Emergency Medicine

## 2023-11-12 ENCOUNTER — Other Ambulatory Visit: Payer: Self-pay

## 2023-11-12 ENCOUNTER — Emergency Department: Payer: BLUE CROSS/BLUE SHIELD

## 2023-11-12 DIAGNOSIS — W109XXA Fall (on) (from) unspecified stairs and steps, initial encounter: Secondary | ICD-10-CM | POA: Diagnosis not present

## 2023-11-12 DIAGNOSIS — S46912A Strain of unspecified muscle, fascia and tendon at shoulder and upper arm level, left arm, initial encounter: Secondary | ICD-10-CM | POA: Diagnosis not present

## 2023-11-12 DIAGNOSIS — M25512 Pain in left shoulder: Secondary | ICD-10-CM | POA: Diagnosis present

## 2023-11-12 MED ORDER — KETOROLAC TROMETHAMINE 30 MG/ML IJ SOLN
30.0000 mg | Freq: Once | INTRAMUSCULAR | Status: AC
  Administered 2023-11-12: 30 mg via INTRAMUSCULAR
  Filled 2023-11-12: qty 1

## 2023-11-12 MED ORDER — NAPROXEN 500 MG PO TABS: 500.0000 mg | ORAL_TABLET | Freq: Two times a day (BID) | ORAL | 2 refills | Status: AC

## 2023-11-12 NOTE — ED Provider Notes (Signed)
   [facility] Provider Note    Event Date/Time   First MD Initiated Contact with Patient 11/12/23 0801     (approximate)   History   Fall and Shoulder Pain   HPI  Holly Herrera is a 57 y.o. female who presents after a fall.  Patient reports she was taking her dog out to walk, slipped down the stairs, left arm pulled behind her, painful shoulder with range of motion.  No other injuries reported.     Physical Exam   Triage Vital Signs: ED Triage Vitals  Encounter Vitals Group     BP 11/12/23 0635 (!) 151/105     Systolic BP Percentile --      Diastolic BP Percentile --      Pulse Rate 11/12/23 0635 89     Resp 11/12/23 0635 18     Temp 11/12/23 0635 98.2 F (36.8 C)     Temp src --      SpO2 11/12/23 0635 96 %     Weight --      Height --      Head Circumference --      Peak Flow --      Pain Score 11/12/23 0636 8     Pain Loc --      Pain Education --      Exclude from Growth Chart --     Most recent vital signs: Vitals:   11/12/23 0639 11/12/23 0848  BP: (!) 139/92 (!) 158/93  Pulse:  73  Resp:  16  Temp:    SpO2:  99%     General: Awake, no distress.  CV:  Good peripheral perfusion.  No clavicular tenderness Resp:  Normal effort.  Abd:  No distention.  Other:  Right arm, normal range of motion, normal exam.  Left arm no bony normalities palpated, mild tenderness over the biceps tendon, painful abduction, normal pulses   ED Results / Procedures / Treatments   Labs (all labs ordered are listed, but only abnormal results are displayed) Labs Reviewed - No data to display   EKG     RADIOLOGY Shoulder x-ray viewed interpret by me, no fracture    PROCEDURES:  Critical Care performed:   Procedures   MEDICATIONS ORDERED IN ED: Medications  ketorolac (TORADOL) 30 MG/ML injection 30 mg (30 mg Intramuscular Given 11/12/23 0850)     IMPRESSION / MDM / ASSESSMENT AND PLAN / ED COURSE  I reviewed the triage vital  signs and the nursing notes. Patient's presentation is most consistent with acute complicated illness / injury requiring diagnostic workup.  Patient presents for fall with left shoulder pain, differential includes sprain, fracture, less likely dislocation based on exam  X-rays negative for fracture/dislocation.  Consistent with sprain, will place in a sling for comfort, IM Toradol, NSAIDs, close follow-up with orthopedics if no improvement.        FINAL CLINICAL IMPRESSION(S) / ED DIAGNOSES   Final diagnoses:  Strain of left shoulder, initial encounter     Rx / DC Orders   ED Discharge Orders          Ordered    naproxen (NAPROSYN) 500 MG tablet  2 times daily with meals        11/12/23 0814             Note:  This document was prepared using Dragon voice recognition software and may include unintentional dictation errors.   Jene Every, MD 11/12/23 [phone]

## 2023-11-12 NOTE — ED Triage Notes (Signed)
Pt reports fall down a couple stairs after tripping while holding dog. Pt reports left shoulder pain after landing on it. Pt denies other injury and denies hitting head. Pt has strong left radial.

## 2024-01-18 NOTE — Progress Notes (Deleted)
Cardiology Office Note  Date:  01/18/2024   ID:  Sybol, Morre March 05, 1966, MRN [id number]  PCP:  Barbette Merino, NP   No chief complaint on file.   HPI:  [name] is a 58 year old woman with past medical history of Diabetes type 2 Hypertension Hyperlipidemia Who presents for follow-up of her palpitations/" heart racing"  Last seen by myself in clinic September 2023    Previously reported episodes of chest pain, Myoview July 2020 with no ischemia Felt to have atypical chest pain  Echocardiogram also completed at that time NORMAL LEFT VENTRICULAR SYSTOLIC FUNCTION  NORMAL RIGHT VENTRICULAR SYSTOLIC FUNCTION  MILD VALVULAR REGURGITATION (See above)  NO VALVULAR STENOSIS  MILD MR, TR  TRIVIAL AR, PR  EF 55-60%   Seen in the emergency room June 28, 2021 for palpitations, dizziness after taking one of her husbands blood pressure pills Work-up negative  In follow-up today she reports that she is feeling relatively well Blood pressure has been well controlled, denies significant leg swelling or shortness of breath Takes chlorthalidone as needed with potassium for leg swelling Denies chest pain concerning for angina Concerned about following cardiac issues given strong family history  EKG personally reviewed by myself on todays visit NSR rate 75 bpm no significant ST or T wave changes  PMH:   has a past medical history of Diabetes mellitus without complication (HCC) and Hypertension.  PSH:    Past Surgical History:  Procedure Laterality Date   BREAST BIOPSY Left 2008   neg, done at surgeon's office- neg    Current Outpatient Medications  Medication Sig Dispense Refill   atorvastatin (LIPITOR) 20 MG tablet SMARTSIG:1 Tablet(s) By Mouth Every Evening     chlorthalidone (HYGROTON) 25 MG tablet Take 25 mg by mouth daily as needed.     cyclobenzaprine (FLEXERIL) 10 MG tablet Take 10 mg by mouth daily.     FEROSUL 325 (65 Fe) MG tablet Take 325 mg by mouth  daily.     glipiZIDE (GLUCOTROL) 5 MG tablet Take 5 mg by mouth daily.     losartan (COZAAR) 100 MG tablet Take 100 mg by mouth daily.     meloxicam (MOBIC) 15 MG tablet Take 15 mg by mouth daily as needed.     metFORMIN (GLUCOPHAGE) 500 MG tablet Take 500 mg by mouth 2 (two) times daily. (Patient not taking: Reported on 09/01/2022)     naproxen (NAPROSYN) 500 MG tablet Take 1 tablet (500 mg total) by mouth 2 (two) times daily with a meal. 20 tablet 2   nitrofurantoin (MACRODANTIN) 100 MG capsule Take 100 mg by mouth 4 (four) times daily. (Patient not taking: Reported on 09/01/2022)     potassium chloride (KLOR-CON) 10 MEQ tablet Take by mouth.     traZODone (DESYREL) 50 MG tablet Take 50 mg by mouth at bedtime.     No current facility-administered medications for this visit.    Allergies:   Sulfa antibiotics   Social History:  The patient  reports that she has never smoked. She has never used smokeless tobacco. She reports current alcohol use. She reports that she does not currently use drugs.   Family History:   family history includes Cerebral aneurysm in her mother; Heart attack (age of onset: 42) in her father; Heart attack (age of onset: 36) in her brother; Heart failure in her father; Hypertension in her sister.    Review of Systems: Review of Systems  Constitutional: Negative.   HENT: Negative.  Respiratory: Negative.    Cardiovascular: Negative.   Gastrointestinal: Negative.   Musculoskeletal: Negative.   Neurological: Negative.   Psychiatric/Behavioral: Negative.    All other systems reviewed and are negative.    PHYSICAL EXAM: VS:  LMP 06/12/2015 Comment: premenopausal , BMI There is no height or weight on file to calculate BMI. GEN: Well nourished, well developed, in no acute distress HEENT: normal Neck: no JVD, carotid bruits, or masses Cardiac: RRR; no murmurs, rubs, or gallops,no edema  Respiratory:  clear to auscultation bilaterally, normal work of breathing GI:  soft, nontender, nondistended, + BS MS: no deformity or atrophy Skin: warm and dry, no rash Neuro:  Strength and sensation are intact Psych: euthymic mood, full affect    Recent Labs: No results found for requested labs within last 365 days.    Lipid Panel No results found for: "CHOL", "HDL", "LDLCALC", "TRIG"    Wt Readings from Last 3 Encounters:  09/01/22 182 lb 4 oz (82.7 kg)  03/15/22 175 lb (79.4 kg)  06/28/21 181 lb (82.1 kg)       ASSESSMENT AND PLAN:  Problem List Items Addressed This Visit   None  Essential hypertension Blood pressure is well controlled on today's visit. No changes made to the medications.  Palpitations Seems to happen more in the evening when she is laying down but typically resolved without intervention We have suggested if symptoms get worse she call us, Zio monitor could be ordered We could also restart her beta-blocker.  She was previously on metoprolol succinate 50 daily.  She does have leftover prescription at home that she can take as needed Recommend she call us if symptoms get worse  Diabetes type 2 We have encouraged continued exercise, careful diet management in an effort to lose weight. She reports weight has been trending down used to be 220 now 182  Hyperlipidemia Minute by primary care , on Lipitor    Total encounter time more than 50 minutes  Greater than 50% was spent in counseling and coordination of care with the patient    Signed, Dossie Arbour, M.D., Ph.D. [facility] [place], [place] [phone]

## 2024-01-21 ENCOUNTER — Ambulatory Visit: Payer: BLUE CROSS/BLUE SHIELD | Attending: Cardiovascular Disease | Admitting: Cardiovascular Disease

## 2024-01-21 DIAGNOSIS — E782 Mixed hyperlipidemia: Secondary | ICD-10-CM

## 2024-01-21 DIAGNOSIS — I10 Essential (primary) hypertension: Secondary | ICD-10-CM

## 2024-01-21 DIAGNOSIS — E119 Type 2 diabetes mellitus without complications: Secondary | ICD-10-CM

## 2024-01-21 DIAGNOSIS — I479 Paroxysmal tachycardia, unspecified: Secondary | ICD-10-CM

## 2024-01-23 ENCOUNTER — Telehealth: Payer: Self-pay

## 2024-01-23 NOTE — Telephone Encounter (Signed)
The patient called in to schedule appointment I inform her that we fax her referral back because her insurance is OON. The patient said that she has a new insurance coming on January 26, 2024, but she doesn't know with who yet. She said that she will call us back when her new insurance come in. I inform her that her provider will have to send her referral back for her colonoscopy, and the patient said that she doesn't want no colonoscopy she just needs a CT scan for her stomach because it hurts. I advised her to call her PCP because her referral was for a colonoscopy not at CT scan and she said that she is going to call her PCP.

## 2024-02-11 ENCOUNTER — Telehealth: Payer: Self-pay | Admitting: Nurse Practitioner

## 2024-02-11 ENCOUNTER — Ambulatory Visit
Admission: RE | Admit: 2024-02-11 | Discharge: 2024-02-11 | Disposition: A | Discharge status: Home / Self Care | Payer: BLUE CROSS/BLUE SHIELD | Attending: Nurse Practitioner | Admitting: Nurse Practitioner

## 2024-02-11 ENCOUNTER — Ambulatory Visit
Admission: RE | Admit: 2024-02-11 | Discharge: 2024-02-11 | Disposition: A | Discharge status: Home / Self Care | Payer: BLUE CROSS/BLUE SHIELD | Source: Ambulatory Visit | Attending: Nurse Practitioner | Admitting: Nurse Practitioner

## 2024-02-11 DIAGNOSIS — M25512 Pain in left shoulder: Secondary | ICD-10-CM | POA: Insufficient documentation

## 2024-04-08 ENCOUNTER — Other Ambulatory Visit: Payer: Self-pay | Admitting: Nurse Practitioner

## 2024-04-08 DIAGNOSIS — Z1231 Encounter for screening mammogram for malignant neoplasm of breast: Secondary | ICD-10-CM

## 2024-05-20 ENCOUNTER — Encounter

## 2024-05-20 ENCOUNTER — Ambulatory Visit
Admission: RE | Admit: 2024-05-20 | Discharge: 2024-05-20 | Disposition: A | Discharge status: Home / Self Care | Source: Ambulatory Visit | Attending: Nurse Practitioner | Admitting: Nurse Practitioner

## 2024-05-20 DIAGNOSIS — Z1231 Encounter for screening mammogram for malignant neoplasm of breast: Secondary | ICD-10-CM | POA: Insufficient documentation

## 2024-07-15 ENCOUNTER — Encounter: Payer: Self-pay | Admitting: Emergency Medicine

## 2024-07-15 ENCOUNTER — Emergency Department
Admission: EM | Admit: 2024-07-15 | Discharge: 2024-07-15 | Disposition: A | Discharge status: Home / Self Care | Attending: Emergency Medicine | Admitting: Emergency Medicine

## 2024-07-15 ENCOUNTER — Emergency Department

## 2024-07-15 ENCOUNTER — Other Ambulatory Visit: Payer: Self-pay

## 2024-07-15 DIAGNOSIS — S63502A Unspecified sprain of left wrist, initial encounter: Secondary | ICD-10-CM | POA: Diagnosis not present

## 2024-07-15 DIAGNOSIS — I10 Essential (primary) hypertension: Secondary | ICD-10-CM | POA: Diagnosis not present

## 2024-07-15 DIAGNOSIS — Y92410 Unspecified street and highway as the place of occurrence of the external cause: Secondary | ICD-10-CM | POA: Diagnosis not present

## 2024-07-15 DIAGNOSIS — S63501A Unspecified sprain of right wrist, initial encounter: Secondary | ICD-10-CM | POA: Insufficient documentation

## 2024-07-15 DIAGNOSIS — S6991XA Unspecified injury of right wrist, hand and finger(s), initial encounter: Secondary | ICD-10-CM | POA: Diagnosis present

## 2024-07-15 DIAGNOSIS — E119 Type 2 diabetes mellitus without complications: Secondary | ICD-10-CM | POA: Insufficient documentation

## 2024-07-15 MED ORDER — BACLOFEN 10 MG PO TABS: 10.0000 mg | ORAL_TABLET | Freq: Three times a day (TID) | ORAL | 0 refills | Status: AC

## 2024-07-15 MED ORDER — OXYCODONE-ACETAMINOPHEN 5-325 MG PO TABS
1.0000 | ORAL_TABLET | Freq: Once | ORAL | Status: AC
  Administered 2024-07-15: 1 via ORAL
  Filled 2024-07-15: qty 1

## 2024-07-15 MED ORDER — MELOXICAM 15 MG PO TABS: 15.0000 mg | ORAL_TABLET | Freq: Every day | ORAL | 2 refills | Status: AC

## 2024-07-15 NOTE — Discharge Instructions (Addendum)
 Apply ice to all areas that hurt Return to emergency department if worsening, see orthopedics if your wrist is not better in 1 week Take medication as prescribed
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
## 2024-07-15 NOTE — ED Notes (Signed)
Right wrist brace applied.  ?

## 2024-07-15 NOTE — ED Notes (Signed)
 Pt has throbbing pain primarily in the hands but it radiates up to elbow. Pt able to move wrists and hands. Small abrasions to right knuckles and left forearm. No pain anywhere else.

## 2024-07-18 ENCOUNTER — Other Ambulatory Visit: Payer: Self-pay | Admitting: Nurse Practitioner

## 2024-07-18 DIAGNOSIS — M25512 Pain in left shoulder: Secondary | ICD-10-CM

## 2024-07-18 DIAGNOSIS — M25412 Effusion, left shoulder: Secondary | ICD-10-CM

## 2024-07-19 ENCOUNTER — Ambulatory Visit
Admission: RE | Admit: 2024-07-19 | Discharge: 2024-07-19 | Disposition: A | Discharge status: Home / Self Care | Source: Ambulatory Visit | Attending: Nurse Practitioner | Admitting: Nurse Practitioner

## 2024-07-19 ENCOUNTER — Other Ambulatory Visit: Payer: Self-pay | Admitting: Nurse Practitioner

## 2024-07-19 DIAGNOSIS — M25412 Effusion, left shoulder: Secondary | ICD-10-CM

## 2024-07-19 DIAGNOSIS — M25512 Pain in left shoulder: Secondary | ICD-10-CM | POA: Diagnosis present

## 2024-07-21 ENCOUNTER — Telehealth: Payer: Self-pay

## 2024-07-21 NOTE — Transitions of Care (Post Inpatient/ED Visit) (Signed)
   07/21/2024  Name: Holly Herrera MRN: [id number] DOB: [birth year]  Today's TOC FU Call Status: Today's TOC FU Call Status:: Unsuccessful Call (1st Attempt) Unsuccessful Call (1st Attempt) Date: 07/21/24  Attempted to reach the patient regarding the most recent Inpatient/ED visit.  Follow Up Plan: Additional outreach attempts will be made to reach the patient to complete the Transitions of Care (Post Inpatient/ED visit) call.   Signature  [employer], [place]

## 2024-07-22 ENCOUNTER — Telehealth: Payer: Self-pay

## 2024-07-22 NOTE — Telephone Encounter (Signed)
 Error, sent to provider

## 2024-07-28 NOTE — Progress Notes (Deleted)
 Cardiology Office Note  Date:  07/29/2024   ID:  Maripat, Borba 1966/03/27, MRN [id number]  PCP:  Myrna Camelia HERO, NP   No chief complaint on file.   HPI:  Ms. Perla Echavarria is a 58 year old woman with past medical history of Diabetes type 2 Hypertension Hyperlipidemia Referred by Camelia Myrna for consultation of her palpitations/ heart racing  Previously seen by myself in clinic 9/23   Previously reported episodes of chest pain, Myoview July 2020 with no ischemia Felt to have atypical chest pain  Echocardiogram also completed at that time NORMAL LEFT VENTRICULAR SYSTOLIC FUNCTION  NORMAL RIGHT VENTRICULAR SYSTOLIC FUNCTION  MILD VALVULAR REGURGITATION (See above)  NO VALVULAR STENOSIS  MILD MR, TR  TRIVIAL AR, PR  EF 55-60%   Seen in the emergency room June 28, 2021 for palpitations, dizziness after taking one of her husbands blood pressure pills Work-up negative  In follow-up today she reports that she is feeling relatively well Blood pressure has been well controlled, denies significant leg swelling or shortness of breath Takes chlorthalidone as needed with potassium for leg swelling Denies chest pain concerning for angina Concerned about following cardiac issues given strong family history  EKG personally reviewed by myself on todays visit NSR rate 75 bpm no significant ST or T wave changes  PMH:   has a past medical history of Diabetes mellitus without complication (HCC) and Hypertension.  PSH:    Past Surgical History:  Procedure Laterality Date   BREAST BIOPSY Left 2008   neg, done at surgeon's office- neg    Current Outpatient Medications  Medication Sig Dispense Refill   atorvastatin (LIPITOR) 20 MG tablet SMARTSIG:1 Tablet(s) By Mouth Every Evening     chlorthalidone (HYGROTON) 25 MG tablet Take 25 mg by mouth daily as needed.     FEROSUL 325 (65 Fe) MG tablet Take 325 mg by mouth daily.     glipiZIDE (GLUCOTROL) 5 MG tablet Take 5 mg by mouth  daily.     losartan (COZAAR) 100 MG tablet Take 100 mg by mouth daily.     meloxicam  (MOBIC ) 15 MG tablet Take 1 tablet (15 mg total) by mouth daily. 30 tablet 2   naproxen  (NAPROSYN ) 500 MG tablet Take 1 tablet (500 mg total) by mouth 2 (two) times daily with a meal. 20 tablet 2   potassium chloride (KLOR-CON) 10 MEQ tablet Take by mouth.     traZODone (DESYREL) 50 MG tablet Take 50 mg by mouth at bedtime.     No current facility-administered medications for this visit.    Allergies:   Sulfa antibiotics   Social History:  The patient  reports that she has never smoked. She has never used smokeless tobacco. She reports current alcohol use. She reports that she does not currently use drugs.   Family History:   family history includes Cerebral aneurysm in her mother; Heart attack (age of onset: 41) in her father; Heart attack (age of onset: 27) in her brother; Heart failure in her father; Hypertension in her sister.    Review of Systems: Review of Systems  Constitutional: Negative.   HENT: Negative.    Respiratory: Negative.    Cardiovascular: Negative.   Gastrointestinal: Negative.   Musculoskeletal: Negative.   Neurological: Negative.   Psychiatric/Behavioral: Negative.    All other systems reviewed and are negative.    PHYSICAL EXAM: VS:  LMP 06/12/2015 Comment: premenopausal , BMI There is no height or weight on file to calculate BMI. GEN:  Well nourished, well developed, in no acute distress HEENT: normal Neck: no JVD, carotid bruits, or masses Cardiac: RRR; no murmurs, rubs, or gallops,no edema  Respiratory:  clear to auscultation bilaterally, normal work of breathing GI: soft, nontender, nondistended, + BS MS: no deformity or atrophy Skin: warm and dry, no rash Neuro:  Strength and sensation are intact Psych: euthymic mood, full affect    Recent Labs: No results found for requested labs within last 365 days.    Lipid Panel No results found for: CHOL, HDL,  LDLCALC, TRIG    Wt Readings from Last 3 Encounters:  07/15/24 179 lb (81.2 kg)  09/01/22 182 lb 4 oz (82.7 kg)  03/15/22 175 lb (79.4 kg)       ASSESSMENT AND PLAN:  Problem List Items Addressed This Visit   None Visit Diagnoses       Paroxysmal tachycardia (HCC)    -  Primary     Diabetes mellitus type 2 in nonobese (HCC)         Palpitations         Mixed hyperlipidemia         Primary hypertension          Essential hypertension Blood pressure is well controlled on today's visit. No changes made to the medications.  Palpitations Seems to happen more in the evening when she is laying down but typically resolved without intervention We have suggested if symptoms get worse she call us , Zio monitor could be ordered We could also restart her beta-blocker.  She was previously on metoprolol succinate 50 daily.  She does have leftover prescription at home that she can take as needed Recommend she call us  if symptoms get worse  Diabetes type 2 We have encouraged continued exercise, careful diet management in an effort to lose weight. She reports weight has been trending down used to be 220 now 182  Hyperlipidemia Minute by primary care , on Lipitor    Total encounter time more than 50 minutes  Greater than 50% was spent in counseling and coordination of care with the patient    Signed, Velinda Lunger, M.D., Ph.D. [facility] [address], [place] [phone]

## 2024-07-29 ENCOUNTER — Ambulatory Visit: Attending: Cardiovascular Disease | Admitting: Cardiovascular Disease

## 2024-07-29 DIAGNOSIS — I479 Paroxysmal tachycardia, unspecified: Secondary | ICD-10-CM

## 2024-07-29 DIAGNOSIS — E782 Mixed hyperlipidemia: Secondary | ICD-10-CM

## 2024-07-29 DIAGNOSIS — E119 Type 2 diabetes mellitus without complications: Secondary | ICD-10-CM

## 2024-07-29 DIAGNOSIS — I10 Essential (primary) hypertension: Secondary | ICD-10-CM

## 2024-07-29 DIAGNOSIS — R002 Palpitations: Secondary | ICD-10-CM

## 2024-08-12 ENCOUNTER — Encounter: Payer: Self-pay | Admitting: Cardiovascular Disease

## 2024-10-27 DIAGNOSIS — E785 Hyperlipidemia, unspecified: Secondary | ICD-10-CM | POA: Insufficient documentation

## 2024-10-27 DIAGNOSIS — R002 Palpitations: Secondary | ICD-10-CM | POA: Insufficient documentation

## 2024-10-27 DIAGNOSIS — I10 Essential (primary) hypertension: Secondary | ICD-10-CM | POA: Insufficient documentation

## 2024-10-27 DIAGNOSIS — E119 Type 2 diabetes mellitus without complications: Secondary | ICD-10-CM | POA: Insufficient documentation

## 2024-10-27 NOTE — Progress Notes (Deleted)
 NO SHOW

## 2024-10-28 ENCOUNTER — Ambulatory Visit: Attending: Cardiovascular Disease | Admitting: Cardiovascular Disease

## 2024-10-28 DIAGNOSIS — R002 Palpitations: Secondary | ICD-10-CM

## 2024-10-28 DIAGNOSIS — E119 Type 2 diabetes mellitus without complications: Secondary | ICD-10-CM

## 2024-10-28 DIAGNOSIS — E782 Mixed hyperlipidemia: Secondary | ICD-10-CM

## 2024-10-28 DIAGNOSIS — I10 Essential (primary) hypertension: Secondary | ICD-10-CM
